# Patient Record
Sex: Female | Born: 1945 | Race: White | Hispanic: No | Marital: Married | Smoking: Never smoker
Health system: Southern US, Community
[De-identification: ages and names within clinical notes are randomized; demographics above are authoritative.]

## PROBLEM LIST (undated history)

## (undated) DIAGNOSIS — I10 Essential (primary) hypertension: Secondary | ICD-10-CM

## (undated) DIAGNOSIS — F410 Panic disorder [episodic paroxysmal anxiety] without agoraphobia: Secondary | ICD-10-CM

## (undated) DIAGNOSIS — K219 Gastro-esophageal reflux disease without esophagitis: Secondary | ICD-10-CM

## (undated) DIAGNOSIS — S82201A Unspecified fracture of shaft of right tibia, initial encounter for closed fracture: Secondary | ICD-10-CM

## (undated) DIAGNOSIS — I499 Cardiac arrhythmia, unspecified: Secondary | ICD-10-CM

## (undated) DIAGNOSIS — F411 Generalized anxiety disorder: Secondary | ICD-10-CM

## (undated) DIAGNOSIS — I82419 Acute embolism and thrombosis of unspecified femoral vein: Secondary | ICD-10-CM

## (undated) DIAGNOSIS — I480 Paroxysmal atrial fibrillation: Secondary | ICD-10-CM

## (undated) DIAGNOSIS — I7100 Dissection of unspecified site of aorta: Secondary | ICD-10-CM

## (undated) DIAGNOSIS — F1010 Alcohol abuse, uncomplicated: Secondary | ICD-10-CM

## (undated) HISTORY — DX: Unspecified fracture of shaft of right tibia, initial encounter for closed fracture: S82.201A

## (undated) HISTORY — DX: Gastro-esophageal reflux disease without esophagitis: K21.9

## (undated) HISTORY — DX: Generalized anxiety disorder: F41.1

## (undated) HISTORY — DX: Paroxysmal atrial fibrillation: I48.0

## (undated) HISTORY — DX: Alcohol abuse, uncomplicated: F10.10

## (undated) HISTORY — DX: Panic disorder (episodic paroxysmal anxiety): F41.0

## (undated) HISTORY — DX: Acute embolism and thrombosis of unspecified femoral vein: I82.419

---

## 1995-06-26 DIAGNOSIS — S82201A Unspecified fracture of shaft of right tibia, initial encounter for closed fracture: Secondary | ICD-10-CM

## 1995-06-26 HISTORY — DX: Unspecified fracture of shaft of right tibia, initial encounter for closed fracture: S82.201A

## 2017-02-26 HISTORY — PX: ASCENDING AORTIC ANEURYSM REPAIR W/ MECHANICAL AORTIC VALVE REPLACEMENT: SHX1192

## 2017-03-01 ENCOUNTER — Encounter: Payer: Self-pay | Admitting: Internal Medicine
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
## 2017-03-10 NOTE — PMR Pre-admission (Incomplete)
[facility] PMR Admission Coordinator Pre-Admission Assessment  Patient: Beth Austin is an 71 y.o., female MRN: [id number] DOB: 1945/09/03 Height:  (177.8 cm) Weight: 85.4 kg (188 lb 4.4 oz)  Insurance Information HMO:     PPO:      PCP:      IPA:      80/20:      OTHER: Open Access Plan Plus PRIMARY: [employer]      Policy#: [id number]      Subscriber: Self CM Name: Donny Pique      Phone#: [phone]     Fax#: [phone] Pre-Cert#: [id number] from 03/08/17-03/14/17      Employer:  Benefits:  Phone #: [phone]     Name: Verified via automated phone line Eff. Date: 06/25/16     Deduct: $250      Out of Pocket Max: $1200      Life Max: N/A CIR: 90%/10%      SNF: 90%/10% Outpatient: PT/OT/SLP     Co-Pay: $50 per visit  Home Health: $50 per calendar year then 90%      Co-Pay: 10% DME: 90%     Co-Pay: 10% Providers: In-network  SECONDARY: None      Policy#:       Subscriber:  CM Name:       Phone#:      Fax#:  Pre-Cert#:       Employer:  Benefits:  Phone #:      Name:  Eff. Date:      Deduct:       Out of Pocket Max:       Life Max:  CIR:       SNF:  Outpatient:      Co-Pay:  Home Health:       Co-Pay:  DME:      Co-Pay:   Medicaid Application Date:       Case Manager:  Disability Application Date:       Case Worker:   Emergency Contact Information Contact Information    Name Relation Home Work [place] Daughter   [phone]   Nobie Putnam [phone]        Current Medical History  Patient Admitting Diagnosis:   History of Present Illness: ***  Patient's medical record from [facility] has been reviewed by the rehabilitation admission coordinator and physician.  Past Medical History  No past medical history on file.  Family History   family history is not on file.  Prior Rehab/Hospitalizations Has the patient had major surgery during 100 days prior to admission? 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  She independent and active.    Prior Functional Level Self Care: Did the patient need help bathing, dressing, using the toilet or eating? Independent  Indoor Mobility: Did the patient need assistance with walking from room to room (with or without device)? Independent  Stairs: Did the patient need assistance with internal or external stairs (with or without device)? Independent  Functional Cognition: Did the patient need help planning regular tasks such as shopping or remembering to take medications? Independent  Home Assistive Devices / Equipment Home Assistive Devices/Equipment: None  Prior  Device Use: Indicate devices/aids used by the patient prior to current illness, exacerbation or injury? None of the above   Prior Functional Level Current Functional Level  Bed Mobility  Independent  Min assist   Transfers  Independent  Min assist   Mobility - Walk/Wheelchair  Independent  Min assist   Upper Body Dressing  Independent  Min assist   Lower Body Dressing  Independent  Mod assist   Grooming  Independent  Min assist   Eating/Drinking  Independent  Min assist   Toilet Transfer  Independent  Min assist   Bladder Continence   Continent   Continent    Bowel Management  Continent   Continent    Stair Climbing  Independent   TBA   Communication  Independent   TBA   Memory  Independent   TBA   Cooking/Meal Prep  Independent       Housework  Independent     Money Management  Independent     Driving  Walked in [place] and used the subway      Special needs/care consideration BiPAP/CPAP: NO CPM: No Continuous Drip IV:  No Dialysis: No        Life Vest: No Oxygen: No Special Bed: No Trach Size: No Wound Vac (area): No       Skin: WDL                              Location: Sternal surgical incision  Bowel mgmt: Continent  Bladder mgmt: Continent, last BM 03/06/17 Diabetic mgmt: No  Previous Home Environment Living Arrangements: Alone  Lives With: Alone Available Help at Discharge: Family, Available 24 hours/day Type of Home: Apartment Home Access: Elevator Bathroom Shower/Tub: Engineer, manufacturing systems: Standard Bathroom Accessibility: Yes How Accessible: Accessible via walker Home Care Services: No Additional Comments: Patient worked out with a Psychologist, educational with focus on balance prior to admission   Discharge Living Setting Plans for Discharge Living Setting: Other (Comment) (Patient from [place] & will stay with sister in [place], [place]) Type of Home at Discharge: House Discharge Home Layout: Two level, Able to live on main level with bedroom/bathroom, Other (Comment) (kitchen and living room upstairs) Alternate Level Stairs-Rails:  (Unknown) Alternate Level Stairs-Number of Steps: 10 Discharge Home Access: Level entry Discharge Bathroom Shower/Tub: Walk-in shower Discharge Bathroom Toilet: Standard Discharge Bathroom Accessibility: Yes How Accessible: Accessible via walker Does the patient have any problems obtaining your medications?: No  Social/Family/Support Systems Patient Roles: Parent, Other (Comment), Partner (Sister) Contact Information: Daughter: Katessa Attridge [phone] Anticipated Caregiver: Daughter, sister, and partner Anticipated Caregiver's Contact Information: see above  Ability/Limitations of Caregiver: None daughter can work remotely  Medical laboratory scientific officer: 24/7 Discharge Plan Discussed with Primary Caregiver: Yes Is Caregiver In Agreement with Plan?: Yes Does Caregiver/Family have Issues with Lodging/Transportation while Pt is in Rehab?: No  Goals/Additional  Needs Patient/Family Goal for Rehab: *** Expected length of stay: *** Cultural Considerations: None Dietary Needs: Regular textures and thin liquids  Equipment Needs: TBD Special Service Needs: None Additional Information: Plan to go stay at sister's in [place] with partner and daughter here to assist. Per [facility] patient will need to be cleared by MD there prior to going back to [place]. Discussed with patient and family. Pt/Family Agrees to Admission and willing to participate: Yes Program Orientation Provided & Reviewed with Pt/Caregiver Including Roles  & Responsibilities: Yes Additional Information Needs: None Information Needs to be Provided By:  N/A  Patient Condition: {PATIENT'S CONDITION:22832}  Preadmission Screen Completed By:  Fae Pippin, 03/10/2017 12:12 PM ______________________________________________________________________   Discussed status with Dr. Marland Kitchen on *** at *** and received telephone approval for admission today.  Admission Coordinator:  Fae Pippin, time ***Dorna Bloom ***   Assessment/Plan: Diagnosis: 1. Does the need for close, 24 hr/day  Medical supervision in concert with the patient's rehab needs make it unreasonable for this patient to be served in a less intensive setting? {yes_no_potentially:3041433} 2. Co-Morbidities requiring supervision/potential complications: *** 3. Due to {due ON:6295284}, does the patient require 24 hr/day rehab nursing? {yes_no_potentially:3041433} 4. Does the patient require coordinated care of a physician, rehab nurse, {coordinated XLKG:4010272} to address physical and functional deficits in the context of the above medical diagnosis(es)? {yes_no_potentially:3041433} Addressing deficits in the following areas: {deficits:3041436} 5. Can the patient actively participate in an intensive therapy program of at least 3 hrs of therapy 5 days a week? {yes_no_potentially:3041433} 6. The potential for patient to make measurable gains while on  inpatient rehab is {potential:3041437} 7. Anticipated functional outcomes upon discharge from inpatients are: {functional outcomes:304600100} PT, {functional outcomes:304600100} OT, {functional outcomes:304600100} SLP 8. Estimated rehab length of stay to reach the above functional goals is: *** 9. Does the patient have adequate social supports to accommodate these discharge functional goals? {yes_no_potentially:3041433} 10. Anticipated D/C setting: {anticipated dc setting:21604} 11. Anticipated post D/C treatments: {post dc treatment:21605} 12. Overall Rehab/Functional Prognosis: {potential:3041437}    RECOMMENDATIONS: This patient's condition is appropriate for continued rehabilitative care in the following setting: {appropriate setting:21606} Patient has agreed to participate in recommended program. {yes_no_potentially:3041433} Note that insurance prior authorization may be required for reimbursement for recommended care.  Comment:  Fae Pippin 03/10/2017

## 2017-03-11 ENCOUNTER — Encounter: Payer: Self-pay | Admitting: Physical Medicine and Rehabilitation

## 2017-03-11 ENCOUNTER — Encounter (HOSPITAL_COMMUNITY): Payer: Self-pay | Admitting: Nurse Practitioner

## 2017-03-11 ENCOUNTER — Other Ambulatory Visit: Payer: Self-pay | Admitting: Physical Medicine and Rehabilitation

## 2017-03-11 ENCOUNTER — Inpatient Hospital Stay (HOSPITAL_COMMUNITY)
Admission: RE | Admit: 2017-03-11 | Discharge: 2017-03-21 | DRG: 057 | Disposition: A | Discharge status: Home / Self Care | Payer: Managed Care, Other (non HMO) | Source: Other Acute Inpatient Hospital | Attending: Physical Medicine & Rehabilitation | Admitting: Physical Medicine & Rehabilitation

## 2017-03-11 DIAGNOSIS — Z9889 Other specified postprocedural states: Secondary | ICD-10-CM | POA: Diagnosis not present

## 2017-03-11 DIAGNOSIS — G4700 Insomnia, unspecified: Secondary | ICD-10-CM | POA: Diagnosis present

## 2017-03-11 DIAGNOSIS — I480 Paroxysmal atrial fibrillation: Secondary | ICD-10-CM | POA: Diagnosis present

## 2017-03-11 DIAGNOSIS — I69398 Other sequelae of cerebral infarction: Secondary | ICD-10-CM | POA: Diagnosis not present

## 2017-03-11 DIAGNOSIS — R270 Ataxia, unspecified: Secondary | ICD-10-CM | POA: Diagnosis present

## 2017-03-11 DIAGNOSIS — K219 Gastro-esophageal reflux disease without esophagitis: Secondary | ICD-10-CM | POA: Diagnosis present

## 2017-03-11 DIAGNOSIS — I638 Other cerebral infarction: Secondary | ICD-10-CM

## 2017-03-11 DIAGNOSIS — F1010 Alcohol abuse, uncomplicated: Secondary | ICD-10-CM | POA: Diagnosis not present

## 2017-03-11 DIAGNOSIS — I639 Cerebral infarction, unspecified: Secondary | ICD-10-CM | POA: Diagnosis present

## 2017-03-11 DIAGNOSIS — I4892 Unspecified atrial flutter: Secondary | ICD-10-CM | POA: Diagnosis present

## 2017-03-11 DIAGNOSIS — I7103 Dissection of thoracoabdominal aorta: Secondary | ICD-10-CM

## 2017-03-11 DIAGNOSIS — I6300 Cerebral infarction due to thrombosis of unspecified precerebral artery: Secondary | ICD-10-CM | POA: Diagnosis not present

## 2017-03-11 DIAGNOSIS — I6319 Cerebral infarction due to embolism of other precerebral artery: Secondary | ICD-10-CM | POA: Diagnosis not present

## 2017-03-11 DIAGNOSIS — Z23 Encounter for immunization: Secondary | ICD-10-CM

## 2017-03-11 DIAGNOSIS — F410 Panic disorder [episodic paroxysmal anxiety] without agoraphobia: Secondary | ICD-10-CM | POA: Diagnosis present

## 2017-03-11 DIAGNOSIS — I69393 Ataxia following cerebral infarction: Principal | ICD-10-CM

## 2017-03-11 DIAGNOSIS — F411 Generalized anxiety disorder: Secondary | ICD-10-CM

## 2017-03-11 DIAGNOSIS — R269 Unspecified abnormalities of gait and mobility: Secondary | ICD-10-CM | POA: Diagnosis not present

## 2017-03-11 DIAGNOSIS — Z86718 Personal history of other venous thrombosis and embolism: Secondary | ICD-10-CM | POA: Diagnosis not present

## 2017-03-11 DIAGNOSIS — R569 Unspecified convulsions: Secondary | ICD-10-CM | POA: Diagnosis not present

## 2017-03-11 DIAGNOSIS — I4891 Unspecified atrial fibrillation: Secondary | ICD-10-CM

## 2017-03-11 DIAGNOSIS — Z952 Presence of prosthetic heart valve: Secondary | ICD-10-CM | POA: Diagnosis not present

## 2017-03-11 DIAGNOSIS — I10 Essential (primary) hypertension: Secondary | ICD-10-CM | POA: Diagnosis present

## 2017-03-11 DIAGNOSIS — D649 Anemia, unspecified: Secondary | ICD-10-CM

## 2017-03-11 DIAGNOSIS — G8191 Hemiplegia, unspecified affecting right dominant side: Secondary | ICD-10-CM | POA: Diagnosis not present

## 2017-03-11 DIAGNOSIS — I63139 Cerebral infarction due to embolism of unspecified carotid artery: Secondary | ICD-10-CM | POA: Diagnosis not present

## 2017-03-11 HISTORY — DX: Essential (primary) hypertension: I10

## 2017-03-11 HISTORY — DX: Cardiac arrhythmia, unspecified: I49.9

## 2017-03-11 LAB — COMPREHENSIVE METABOLIC PANEL
ALT: 55 U/L — ABNORMAL HIGH (ref 14–54)
AST: 18 U/L (ref 15–41)
Albumin: 2.9 g/dL — ABNORMAL LOW (ref 3.5–5.0)
Alkaline Phosphatase: 78 U/L (ref 38–126)
Anion gap: 7 (ref 5–15)
BUN: 11 mg/dL (ref 6–20)
CHLORIDE: 105 mmol/L (ref 101–111)
CO2: 25 mmol/L (ref 22–32)
Calcium: 8.5 mg/dL — ABNORMAL LOW (ref 8.9–10.3)
Creatinine, Ser: 0.72 mg/dL (ref 0.44–1.00)
Glucose, Bld: 102 mg/dL — ABNORMAL HIGH (ref 65–99)
POTASSIUM: 4.2 mmol/L (ref 3.5–5.1)
Sodium: 137 mmol/L (ref 135–145)
TOTAL PROTEIN: 5.3 g/dL — AB (ref 6.5–8.1)
Total Bilirubin: 0.6 mg/dL (ref 0.3–1.2)

## 2017-03-11 MED ORDER — AMIODARONE HCL 200 MG PO TABS: 200.0000 mg | ORAL_TABLET | Freq: Every day | ORAL | Status: DC

## 2017-03-11 MED ORDER — TRAMADOL HCL 50 MG PO TABS
50.0000 mg | ORAL_TABLET | Freq: Four times a day (QID) | ORAL | Status: DC | PRN
  Administered 2017-03-11 – 2017-03-14 (×6): 50 mg via ORAL
  Filled 2017-03-11 (×6): qty 1

## 2017-03-11 MED ORDER — AMIODARONE HCL 200 MG PO TABS: 200.0000 mg | ORAL_TABLET | Freq: Two times a day (BID) | ORAL | Status: DC

## 2017-03-11 MED ORDER — ACETAMINOPHEN 325 MG PO TABS
325.0000 mg | ORAL_TABLET | ORAL | Status: DC | PRN
  Administered 2017-03-12 – 2017-03-20 (×4): 650 mg via ORAL
  Filled 2017-03-11 (×3): qty 2

## 2017-03-11 MED ORDER — LEVETIRACETAM 500 MG PO TABS
1000.0000 mg | ORAL_TABLET | Freq: Two times a day (BID) | ORAL | Status: DC
  Administered 2017-03-11 – 2017-03-21 (×20): 1000 mg via ORAL
  Filled 2017-03-11 (×20): qty 2

## 2017-03-11 MED ORDER — METOPROLOL TARTRATE 12.5 MG HALF TABLET
12.5000 mg | ORAL_TABLET | Freq: Two times a day (BID) | ORAL | Status: DC
  Administered 2017-03-11 – 2017-03-20 (×19): 12.5 mg via ORAL
  Filled 2017-03-11 (×20): qty 1

## 2017-03-11 MED ORDER — ENOXAPARIN SODIUM 40 MG/0.4ML ~~LOC~~ SOLN
40.0000 mg | SUBCUTANEOUS | Status: DC
  Administered 2017-03-11 – 2017-03-12 (×2): 40 mg via SUBCUTANEOUS
  Filled 2017-03-11 (×2): qty 0.4

## 2017-03-11 MED ORDER — HYDROCORTISONE 0.5 % EX CREA
TOPICAL_CREAM | Freq: Two times a day (BID) | CUTANEOUS | Status: DC
  Filled 2017-03-11: qty 28.35

## 2017-03-11 MED ORDER — ALPRAZOLAM 0.25 MG PO TABS
0.2500 mg | ORAL_TABLET | Freq: Three times a day (TID) | ORAL | Status: DC | PRN
  Administered 2017-03-11 – 2017-03-13 (×5): 0.25 mg via ORAL
  Filled 2017-03-11 (×5): qty 1

## 2017-03-11 MED ORDER — LIDOCAINE 5 % EX PTCH: 1.0000 | MEDICATED_PATCH | CUTANEOUS | Status: DC

## 2017-03-11 MED ORDER — ALUM & MAG HYDROXIDE-SIMETH 200-200-20 MG/5ML PO SUSP
30.0000 mL | ORAL | Status: DC | PRN
Start: 2017-03-11 — End: 2017-03-21

## 2017-03-11 MED ORDER — ATORVASTATIN CALCIUM 40 MG PO TABS
40.0000 mg | ORAL_TABLET | Freq: Every day | ORAL | Status: DC
  Administered 2017-03-11 – 2017-03-20 (×10): 40 mg via ORAL
  Filled 2017-03-11 (×10): qty 1

## 2017-03-11 MED ORDER — MIRTAZAPINE 15 MG PO TABS: 7.5000 mg | ORAL_TABLET | Freq: Every day | ORAL | Status: DC

## 2017-03-11 MED ORDER — PROCHLORPERAZINE EDISYLATE 5 MG/ML IJ SOLN: 5.0000 mg | Freq: Four times a day (QID) | INTRAMUSCULAR | Status: DC | PRN

## 2017-03-11 MED ORDER — POLYETHYLENE GLYCOL 3350 17 G PO PACK: 17.0000 g | PACK | Freq: Every day | ORAL | Status: DC | PRN

## 2017-03-11 MED ORDER — DIPHENHYDRAMINE HCL 12.5 MG/5ML PO ELIX: 12.5000 mg | ORAL_SOLUTION | Freq: Four times a day (QID) | ORAL | Status: DC | PRN

## 2017-03-11 MED ORDER — PROCHLORPERAZINE MALEATE 5 MG PO TABS: 5.0000 mg | ORAL_TABLET | Freq: Four times a day (QID) | ORAL | Status: DC | PRN

## 2017-03-11 MED ORDER — INFLUENZA VAC SPLIT HIGH-DOSE 0.5 ML IM SUSY
0.5000 mL | PREFILLED_SYRINGE | INTRAMUSCULAR | Status: AC
  Administered 2017-03-12: 0.5 mL via INTRAMUSCULAR
  Filled 2017-03-11: qty 0.5

## 2017-03-11 MED ORDER — SENNOSIDES-DOCUSATE SODIUM 8.6-50 MG PO TABS
1.0000 | ORAL_TABLET | Freq: Every day | ORAL | Status: DC
  Administered 2017-03-11: 1 via ORAL
  Filled 2017-03-11: qty 1

## 2017-03-11 MED ORDER — AMIODARONE HCL 200 MG PO TABS: 100.0000 mg | ORAL_TABLET | Freq: Two times a day (BID) | ORAL | Status: DC

## 2017-03-11 MED ORDER — FLEET ENEMA 7-19 GM/118ML RE ENEM: 1.0000 | ENEMA | Freq: Once | RECTAL | Status: DC | PRN

## 2017-03-11 MED ORDER — HYDROCORTISONE VALERATE 0.2 % EX CREA
TOPICAL_CREAM | Freq: Two times a day (BID) | CUTANEOUS | Status: DC
  Filled 2017-03-11: qty 15

## 2017-03-11 MED ORDER — GUAIFENESIN-DM 100-10 MG/5ML PO SYRP: 5.0000 mL | ORAL_SOLUTION | Freq: Four times a day (QID) | ORAL | Status: DC | PRN

## 2017-03-11 MED ORDER — AMIODARONE HCL 200 MG PO TABS: 400.0000 mg | ORAL_TABLET | Freq: Two times a day (BID) | ORAL | Status: DC

## 2017-03-11 MED ORDER — PANTOPRAZOLE SODIUM 20 MG PO TBEC
20.0000 mg | DELAYED_RELEASE_TABLET | Freq: Every day | ORAL | Status: DC
  Administered 2017-03-12 – 2017-03-21 (×10): 20 mg via ORAL
  Filled 2017-03-11 (×11): qty 1

## 2017-03-11 MED ORDER — PROCHLORPERAZINE 25 MG RE SUPP: 12.5000 mg | Freq: Four times a day (QID) | RECTAL | Status: DC | PRN

## 2017-03-11 MED ORDER — ALPRAZOLAM 0.25 MG PO TABS: 0.2500 mg | ORAL_TABLET | Freq: Two times a day (BID) | ORAL | Status: DC | PRN

## 2017-03-11 MED ORDER — IBUPROFEN 400 MG PO TABS
400.0000 mg | ORAL_TABLET | Freq: Four times a day (QID) | ORAL | Status: DC | PRN
  Filled 2017-03-11: qty 1

## 2017-03-11 MED ORDER — TRAZODONE HCL 50 MG PO TABS: 25.0000 mg | ORAL_TABLET | Freq: Every evening | ORAL | Status: DC | PRN

## 2017-03-11 MED ORDER — AMIODARONE HCL 200 MG PO TABS
200.0000 mg | ORAL_TABLET | Freq: Two times a day (BID) | ORAL | Status: DC
  Administered 2017-03-11 – 2017-03-13 (×4): 200 mg via ORAL
  Filled 2017-03-11 (×4): qty 1

## 2017-03-11 MED ORDER — ASPIRIN EC 325 MG PO TBEC
325.0000 mg | DELAYED_RELEASE_TABLET | Freq: Every day | ORAL | Status: DC
  Administered 2017-03-12 – 2017-03-13 (×2): 325 mg via ORAL
  Filled 2017-03-11 (×2): qty 1

## 2017-03-11 MED ORDER — BISACODYL 10 MG RE SUPP: 10.0000 mg | Freq: Every day | RECTAL | Status: DC | PRN

## 2017-03-11 NOTE — Consult Note (Addendum)
Cardiology Consultation   Patient ID: Beth Austin; [id number]; Sep 05, 1945   Admit date: 03/11/2017 Date of Consult: 03/11/2017  Referring Provider:  PM&R  Primary Care Provider: No primary care provider on file. Cardiologist: NA Electrophysiologist:  NA  Reason for Consultation: afib/flutter  History of Present Illness: Beth Austin is a 71 y.o. female who is being seen today for the evaluation of afib at the request of PMR service.  Beth Austin is a 71 year old female who was emergently admitted to [facility] 02/26/2017 with seizure, pain radiating from chest to back and RLE due to aortic dissection. She was flight from [place] to [place], started seizing and plane has to be diverted to [facility].  She was intubated in ED, RLE noted to be cold and taken to OR emergently for repair of thoracoabdominal aortic dissection with Bental procedure and AVR by Dr. Nolon Rod. 2D echo with EF 40-45% with mild LVH, mild to moderately global LVD and mild MVR/TVR.  She continued to have seizure with right sided motor activity on 9/6 and neurology recommended increasing Keppra to 1000 mg bid. To remain on keppra lifelong due to family reports of episodes suggestive of epilepsy going back to 10 years. 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 She developed A fib/A flutter requiring DCCV 9/11 by Dr.  and continues on BB and amiodarone for rate control. It seems that she was transferred to [facility] for rehab due to family connections in the area. We are called to see pt due to recurrence of afib/atrial flutter w/ RVR on EKG with HR ~100-120s. Pt tells me she was in NSR when leaving [place] today around noon, but was in afib/flutter by the time she arrived at [facility] this evening. By the time I came to see pt, she was in NSR with HR 70s. She tells me she was completely asymptomatic in regard to the afib; she has not had palpitations, worsening SOB while in the  arrhythmia, dizziness or any other sx. She was unaware her heart was out of rhythm. Nothing specific has been done for the afib this evening aside from pt getting her scheduled doses of lopressor 12.5mg  and amiodarone .   Past Medical History:  Diagnosis Date  . Alcohol abuse   . Dvt femoral (deep venous thrombosis) (HCC)    after tibial fracture  . Dysrhythmia   . Generalized anxiety disorder with panic attacks   . GERD (gastroesophageal reflux disease)   . Hypertension   . PAF (paroxysmal atrial fibrillation) (HCC)    resolved with carotid massage in the past  . Right tibial fracture 1997   treated with cast    Past Surgical History:  Procedure Laterality Date  . ASCENDING AORTIC ANEURYSM REPAIR W/ MECHANICAL AORTIC VALVE REPLACEMENT  02/26/2017      Current Medications: . amiodarone  200 mg Oral BID   Followed by  . [START ON 03/18/2017] amiodarone  100 mg Oral BID  . [START ON 03/12/2017] aspirin EC  325 mg Oral Daily  . atorvastatin  40 mg Oral q1800  . enoxaparin (LOVENOX) injection  40 mg Subcutaneous Q24H  . hydrocortisone cream   Topical BID  . [START ON 03/12/2017] Influenza vac split quadrivalent PF  0.5 mL Intramuscular Tomorrow-1000  . levETIRAcetam  1,000 mg Oral BID  . metoprolol tartrate  12.5 mg Oral BID  . [START ON 03/12/2017] pantoprazole  20 mg Oral Daily  . senna-docusate  1  tablet Oral QHS    Infused Medications:   PRN Medications: acetaminophen, ALPRAZolam, alum & mag hydroxide-simeth, bisacodyl, diphenhydrAMINE, guaiFENesin-dextromethorphan, ibuprofen, polyethylene glycol, prochlorperazine **OR** prochlorperazine **OR** prochlorperazine, sodium phosphate, traMADol, traZODone   Allergies:    Allergies  Allergen Reactions  . Latex Itching    Pain patch put on back at [facility] caused itching    Social History:   The patient      Family History:   The patient's family history includes AAA (abdominal aortic aneurysm) (age of onset: 84) in  her mother; Colon cancer in her sister; Lung cancer (age of onset: 50) in her father; Stroke in her maternal grandmother.   ROS:  Please see the history of present illness.  All other ROS reviewed and negative.     Vital Signs: Blood pressure 134/73, pulse (!) 127, temperature 98.4 F (36.9 C), temperature source Oral, height  (1.778 m), weight 85.3 kg (188 lb), SpO2 (!) 83 %.   PHYSICAL EXAM: General:  Well nourished, well developed, in no acute distress HEENT: normal Lymph: no adenopathy Neck: no JVD Endocrine:  No thryomegaly Vascular: No carotid bruits; DP pulses 2+ bilaterally   Cardiac:  Healing surgical scar. normal S1, mechanical S2; RRR; 2/6 sys murmur at the base. Lungs:  clear to auscultation bilaterally anteriorly, no wheezing, rhonchi or rales  Abd: soft, nontender, no hepatomegaly  Ext: no edema Musculoskeletal:  No deformities, BUE and BLE strength normal and equal Skin: warm and dry  Neuro:  CNs 2-12 intact, no focal abnormalities noted Psych:  Normal affect   EKG:  Atrial flutter w/ variable AV block, HR 95 NSR on the monitor  Labs: No results for input(s): CKTOTAL, CKMB, TROPONINI in the last 72 hours. No results for input(s): TROPIPOC in the last 72 hours.  No results found for: WBC, HGB, HCT, MCV, PLT No results for input(s): NA, K, CL, CO2, BUN, CREATININE, CALCIUM, PROT, BILITOT, ALKPHOS, ALT, AST, GLUCOSE in the last 168 hours.  Invalid input(s): LABALBU No results found for: CHOL, HDL, LDLCALC, TRIG No results found for: DDIMER  Radiology/Studies:  No results found.  ASSESSMENT AND PLAN:  1. Atrial fib/flutter: she is not on OAC due to recent aortic dissection repair. She has been getting amiodarone PO but not sure the extent of the loading dose she has gotten so far. She is now back in NSR w/o intervention. I am going to increase the amiodarone to  bid; we can try to get some records from [facility]. No need for further intervention at this  time. She has had AVR; notes indicate the valve is mechanical. She will need to be on coumadin for that which will help decrease afib-related risk of CVA.  She is currently on lovenox but appears to be a prophylactic dose. Surgery may have wanted to wait before starting therapeutic A/C but this needs to be investigated further.  2. Aortic dissection: s/p repair, stable. Cont rehab as per PMR  3. Mild LV systolic dysfunction: per reports, echo at [facility] showed EF 45%. No need for repeat TTE at this time  4. S/p AVR: mechanical prosthesis. Obtain echo report from [facility]. Needs A/C--need more info from [facility] re: what the surgeons wanted to do with that   Thank you for the opportunity to participate in the care of this very pleasant patient. Will follow. Please call w/ questions.   Precious Reel, MD , [facility] 03/11/17 9:45 PM  ADDENDUM: as pt is back in NSR currently, I  am actually going to leave the amiodarone dose at  bid for now. It appears that she is completing the loading regimen as directed by the surgeons. If she has recurrent afib tonight, can bolus w/ amio IV 150.

## 2017-03-11 NOTE — Progress Notes (Signed)
Patient ID: Beth Austin, female   DOB: 12-Jun-1946, 71 y.o.   MRN: [id number] Patient admitted to [facility] via EMS transport accompanied by son and daughter.  Patient and family oriented to rehab process.  Marissa Nestle, PA and Dr. Allena Katz present to perform admission assessment.  Patient appears a bit anxious, as does the family, but appears to be in no immediate distress at this time.  Dani Gobble, RN

## 2017-03-11 NOTE — Progress Notes (Signed)
Fae Pippin Rehab Admission Coordinator Signed Physical Medicine and Rehabilitation  PMR Pre-admission Encounter Date: 03/10/2017  Related encounter: Documentation from 03/10/2017 in [facility] MEDICINE AND REHABILITATION       Hide copied text                   Secondary Market PMR Admission Coordinator Pre-Admission Assessment  Patient:Beth Austin an 70 y.o., female ZOX:[id number] DOB:February 18, 1946 Height:5\' 10"  (177.8 cm) Weight:85.4 kg (188 lb 4.4 oz)  Insurance Information HMO: PPO: PCP: IPA:80/20: OTHER: Open Access Plan Plus PRIMARY: CignaPolicy#: [id number] Subscriber: Self CM Name: Darl Pikes FrontePhone#: [phone] Fax#: [phone] Pre-Cert#: [id number] from 03/08/17-9/20/18Employer:  Benefits: Phone #: [phone]: Verified via automated phone line Eff. Date: 1/1/18Deduct: $250Out of Pocket Max: $1200Life Max: N/A CIR: 90%/10%SNF: 90%/10% Outpatient:PT/OT/SLPCo-Pay: $50 per visit  Home Health: $50 per calendar year then 90%Co-Pay: 10% DME: 90%Co-Pay: 10% Providers: In-network  SECONDARY: NonePolicy#: Subscriber:  CM Name: Phone#: Fax#:  Pre-Cert#: Employer:  Benefits: Phone #: Name:  Eff. Date: Deduct: Out of Pocket Max: Life Max:  CIR: SNF:  Outpatient:Co-Pay:  Home Health: Co-Pay:  DME: Co-Pay:   Medicaid Application Date:Case Manager: Disability Application Date:Case Worker:  Emergency Actuary Information   Name Relation Home Work [place] Daughter   [phone]   Nobie Putnam [phone]        Current Medical History Patient Admitting Diagnosis:Aortic dissection and acute or early subacute small vessel infarct involving the left posterior limb of the internal  capsule  History of Present Illness: Beth Austin is a 71 year old female, with a past medical history significant for epilepsy.  She presented to [facility] with a type A aortic dissection on 02/26/17 after her flight from [place], [place] to [place] was emergently landed due to reportedly seizing on the plane.  Patient intubated 02/26/17-03/01/17.  Cardiology and neurology consulted and followed; MRI revealed acute or early subacute infarct.  Therapies initiated 03/03/17 with recommendations for intensive post acute therapies given her independence prior to.  Patient medically stable and admitted to [facility] Rehabilitation 03/11/17.  Patient's medical record from [facility] has been reviewed by the rehabilitation admission coordinator and physician.  Past Medical History Epilepsy.  Family History family history is not on file.  Prior Rehab/Hospitalizations Has the patient had major surgery during 100 days prior to admission? No  Current Medications Acetaminophen Amiodarone Asprin Bisacodyl Heparin Hydrocortisone Insulin Levetiracetam Lidocaine Melatonin Metoprolol tartrate Pantoprazole Pneumococcal Senna-docusate  Patients Current Diet: Regular textures and thin liquids with heart healthy restrictions   Precautions / Restrictions Precautions Precautions: Sternal Restrictions Weight Bearing Restrictions: No  Has the patient had 2 or more falls or a fall with injury in the past year?No  Prior Activity Level Community (5-7x/wk): Prior to admission patient lived in [place] and worked as a Airline pilot. She independent and active.   Prior Functional Level Self Care: Did the patient need help bathing, dressing, using the toilet or eating? Independent  Indoor Mobility: Did the patient need assistance with walking from room to room (with or without device)? Independent  Stairs: Did the patient need assistance with internal or external stairs (with  or without device)? Independent  Functional Cognition: Did the patient need help planning regular tasks such as shopping or remembering to take medications? Independent  Home Assistive Devices / Equipment Home Assistive Devices/Equipment: None  Prior Device Use: Indicate devices/aids used by the patient prior to current illness, exacerbation or injury? None of the  above   Prior Functional Level Current Functional Level  Bed Mobility  Independent  Min assist   Transfers  Independent  Min assist   Mobility - Walk/Wheelchair  Independent  Min assist   Upper Body Dressing  Independent  Min assist   Lower Body Dressing  Independent  Mod assist   Grooming  Independent  Min assist   Eating/Drinking  Independent  Min assist   Toilet Transfer  Independent  Min assist   Bladder Continence   Continent   Continent    Bowel Management  Continent   Continent    Stair Climbing  Independent   TBA   Communication  Independent   TBA   Memory  Independent   TBA   Cooking/Meal Prep  Independent     Housework  Independent     Money Management  Independent     Driving  Walked in [place] and used the subway      Special needs/care consideration BiPAP/CPAP: No CPM: No Continuous Drip IV: No Dialysis: No Life Vest: No Oxygen: No Special Bed: No Trach Size: No Wound Vac (area): No Skin: WDLLocation: Sternal surgical incision  Bowel mgmt: Continent  Bladder mgmt: Continent, last BM 03/06/17 Diabetic mgmt: No  Previous Home Environment Living Arrangements: Alone Lives With: Alone Available Help at Discharge: Family, Available 24 hours/day Type of Home: Apartment Home Access: Elevator Bathroom Shower/Tub: Engineer, manufacturing systems: Standard Bathroom Accessibility: Yes How Accessible: Accessible via walker Home Care Services: No Additional  Comments: Patient worked out with a Psychologist, educational with focus on balance prior to admission   Discharge Living Setting Plans for Discharge Living Setting: Other (Comment) (Patient from [place] & will stay with sister in [place], [place]) Type of Home at Discharge: House Discharge Home Layout: Two level, Able to live on main level with bedroom/bathroom, Other (Comment) (kitchen and living room upstairs) Alternate Level Stairs-Rails: (Unknown) Alternate Level Stairs-Number of Steps: 10 Discharge Home Access: Level entry Discharge Bathroom Shower/Tub: Walk-in shower Discharge Bathroom Toilet: Standard Discharge Bathroom Accessibility: Yes How Accessible: Accessible via walker Does the patient have any problems obtaining your medications?: No  Social/Family/Support Systems Patient Roles: Parent, Other (Comment), Partner (Sister) Contact Information: Daughter: Jaycie Kregel [phone] Anticipated Caregiver: Daughter, sister, and partner Anticipated Caregiver's Contact Information: see above  Ability/Limitations of Caregiver: None daughter can work remotely  Medical laboratory scientific officer: 24/7 Discharge Plan Discussed with Primary Caregiver: Yes Is Caregiver In Agreement with Plan?: Yes Does Caregiver/Family have Issues with Lodging/Transportation while Pt is in Rehab?: No  Goals/Additional Needs Patient/Family Goal for Rehab: PT/OT/SLP Supervision-Mod I Expected length of stay: 10-14 days Cultural Considerations: None Dietary Needs: Regular textures and thin liquids  Equipment Needs: TBD Special Service Needs: None Additional Information: Plan to go stay at sister's in [place] with partner and daughter here to assist. Per [facility] patient will need to be cleared by MD there prior to going back to [place]. Discussed with patient and family. Pt/Family Agrees to Admission and willing to participate: Yes Program Orientation Provided &Reviewed with Pt/Caregiver Including Roles &Responsibilities:  Yes Additional Information Needs: None Information Needs to be Provided By: N/A  Patient Condition:Patient's medical record has been reviewed and I have have spoken with daughter and patient over the phone.  Given that this patient was full independent and active prior to admission she makes an excellent IP Rehab candidate.  Additionally, she has good family support from out of town as well as locally her in the [place] area, who will be assisting  post discharge.  Currently, she requires medical management for cardiac and neuro recovery and she would greatly benefit from 24/7 nursing and daily doctor visits.  Further more, since she is requiring Min-Mod A with activities of daily living she would also benefit from the intensive 3 hours of skilled therapy a day.    Preadmission Screen Completed By: Fae Pippin, 9/16/201812:12 PM ______________________________________________________________________  Discussed status with Dr. Allena Katz on 03/11/17 at 1030 and received telephone approval for admission today.  Admission Coordinator: Fae Pippin, time 1030/Date 03/11/17   Assessment/Plan: Diagnosis:  left posterior limb of the internal capsule  1. Does the need for close, 24 hr/day Medical supervision in concert with the patient's rehab needs make it unreasonable for this patient to be served in a less intensive setting? Yes 2. Co-Morbidities requiring supervision/potential complications: epilepsy 3. Due to safety, disease management and patient education, does the patient require 24 hr/day rehab nursing? Yes 4. Does the patient require coordinated care of a physician, rehab nurse, PT (1-2 hrs/day, 5 days/week), OT (1-2 hrs/day, 5 days/week) and SLP (1-2 hrs/day, 5 days/week)to address physical and functional deficits in the context of the above medical diagnosis(es)? Yes Addressing deficits in the following areas: balance, endurance, locomotion, strength, transferring, bathing, dressing,  toileting, speech, swallowing and psychosocial support 5. Can the patient actively participate in an intensive therapy program of at least 3 hrs of therapy 5 days a week? Yes 6. The potential for patient to make measurable gains while on inpatient rehab is excellent 7. Anticipated functional outcomes upon discharge from inpatients are: supervisionPT, supervisionOT, modified independentSLP 8. Estimated rehab length of stay to reach the above functional goals is: 12-16 days. 9. Does the patient have adequate social supports to accommodate these discharge functional goals? Potentially 10. Anticipated D/C setting: Home 11. Anticipated post D/C treatments: HH therapy and Home excercise program 12. Overall Rehab/Functional Prognosis: good    RECOMMENDATIONS: This patient's condition is appropriate for continued rehabilitative care in the following setting: CIR Patient has agreed to participate in recommended program. Yes Note that insurance prior authorization may be required for reimbursement for recommended care.  Maryla Morrow, MD, [place] [facility] 03/11/17 Fae Pippin 03/10/2017     Revision History

## 2017-03-11 NOTE — Significant Event (Signed)
Rapid Response Event Note  Overview: Time Called: 1951 Arrival Time: 2000    Initial Focused Assessment: Called by [facility], RN for pt is Atrial  fib with RVR  Upon my arrival to room pt is alert, orieted x 4, cooperative, MAE, looking frightened.  Denies CP or SOB.  12 Lead EKG at 1911  Shows AFIB RVR rate 123.  134/73 RR 18    99% on 4l [place].  97.5 oral . Palpable pulses throughout.  abd soft, active bowel sounds.   Interventions: Placed on portable cardiac monitor in AFIB rate 105.    IV access obtained and pt given NS 500 cc bolus.    Scheduled Lopressor 12.5 mg po and Amiodarone 200 mg given.  Prn Xanax .25 given for anxiety.  UPdated family at bedside   Repeat EKG:  Atrial flutter with variable block.  Rate 97  BP 122/68   20    Plan of Care (if not transferred): awaiting cardiology consult.  Continue to monitor heart rate and rhythm Hand off report given to [facility], RN  Will continue to follow as needed.  Pt and family updated on POC  :  Event Summary: Name of Physician Notified: Deatra Ina, PA at [phone]  Name of Consulting Physician Notified: Cardiology at    Outcome: Stayed in room and stabalized     Winfrey Chillemi, [place]

## 2017-03-11 NOTE — Progress Notes (Signed)
Patient was admitted to IP Rehab today from the [facility]. Patient was noted by previous nurse to have an irregular heart rate & an EKG was performed. Abnormal results were given to on call provider. Provider recieved information for on call cardiac providers. After that communication, patient's oxygen saturation had dropped into the low 80's. Another EKG was taken & the results showed A FIb & A flutter. Heart rate was ranging from 80's-130's. Previous nurse called rapid response & an IV was started for emergency fluids & possible medication administration. Oxygen given via nasal canula at 3.5 lpm. A portable heart monitor was placed & patient given her scheduled dose of amiodarone & lopressor and a does of xanax. On call cardiac team was contacted & patient was stabilizing. Cardiac provider arrived at approximately 2120 & saw the patient. Another EKG was performed. Patient had stabilized, HR, O2 sat, BP were WNL. No symptoms reported. Labs were ordered & drawn. Other scheduled meds given, no c/o pain or discomfort. On call provider was call to give update & he gave orders to stop the IV infusion & oxygen at this time. Will evaluate in the morning. No acute distress noted at this time. Will continue to monitor.

## 2017-03-11 NOTE — Progress Notes (Signed)
Patients HR was irregular when RN auscultated earlier.  Discussed with Marissa Nestle, PA of her findings, she reported her HR was regular.  Ordered to get a baseline EKG.  Abnormal EKG. Report reads: "Atrial fibrillation with rapid ventricular response.  Nonspecific ST and T wave abnormality.  Abnormal ECG." Called Deatra Ina, PA.  He is consulting cardiology.  Patient shivering saying she is cold.  HR noted to range anywhere from 112-133.  Patient denies any chest pain or pressure.  Dani Gobble, RN

## 2017-03-11 NOTE — H&P (Signed)
Physical Medicine and Rehabilitation Admission H&P    CC: AAA aneurysm rupture   HPI:  Beth Austin is a 71 year old female who was emergently admitted to [facility] 02/26/2017 with seizure, pain radiating from chest to back and RLE due to aortic dissection. She was flight from [place] to [place], started seizing and plane has to be diverted to RDU.  She was intubated in ED, RLE noted to be cold and taken to OR emergently for repair of thoracoabdominal aortic dissection with Bental procedure and AVR by Dr. Nolon Rod. 2D echo with EF 40-45% with mild LVH, mild to moderately global LVD and mild MVR/TVR.  She continued to have seizure with right sided motor activity on 9/6 and neurology recommended increasing Keppra to 1000 mg bid. To remain on keppra lifelong due to family reports of episodes suggestive of epilepsy going back to 10 years. 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 She developed A fib/A flutter requiring DCCV 9/11 by Dr.  and continues on BB and amiodarone for rate control. Anxiety/ insomnia being managed with prn xanax. Fluid overload treated with diuresis. Pre op EF < 40%--blood pressures remain on low side--SBP< 100,  limiting ACE/ARB use.     Review of Systems  Respiratory: Positive for shortness of breath.   Cardiovascular: Positive for chest pain (Incisional).  Musculoskeletal: Negative for back pain and myalgias.  Psychiatric/Behavioral: The patient is nervous/anxious and has insomnia.   All other systems reviewed and are negative.    Past Medical History:  Diagnosis Date  . Alcohol abuse   . Dvt femoral (deep venous thrombosis) (HCC)    after tibial fracture  . Dysrhythmia   . Generalized anxiety disorder with panic attacks   . GERD (gastroesophageal reflux disease)   . Hypertension   . PAF (paroxysmal atrial fibrillation) (HCC)    resolved with carotid massage in the past  . Right tibial fracture 1997   treated with cast    Past Surgical History:  Procedure Laterality Date  . ASCENDING AORTIC ANEURYSM REPAIR W/ MECHANICAL AORTIC VALVE REPLACEMENT  02/26/2017      Family History  Problem Relation Age of Onset  . AAA (abdominal aortic aneurysm) Mother 45       diet of rupture  . Lung cancer Father 20  . Colon cancer Sister   . Stroke Maternal Grandmother       Social History:  Single. Lives alone in [place] -works out daily with trainer?  She does not use tobacco. Uses alcohol   drug history on file.    Allergies: No Known Allergies  (Not in a hospital admission)  Drug Regimen Review  Drug regimen was reviewed and remains appropriate with no significant issues identified  Home:    Functional History: Independent PTA.    Functional Status:  Mobility: Min assist for transfers with min cues to maintain sternal precautions.  CGA for standing balance Able to ambulate 50' with rest breaks and cues for posture/RW use.     ADL: Needs verbal/visual cues to complete ADL tasks safely.  Min assist for grooming. Min assist for UB care Mod assist for LB care   Cognition: Mild to moderate cognitive deficits with MoCA 22/30. Mild impulsivity with poor insight/awareness of deficits and decreased recall   There were no vitals taken for this visit. Physical Exam  Nursing note reviewed. Constitutional: She is oriented to person, place, and time. She appears well-developed and well-nourished.  No distress.  HENT:  Head: Normocephalic and atraumatic.  Mouth/Throat: Oropharynx is clear and moist.  Eyes: Pupils are equal, round, and reactive to light. Conjunctivae and EOM are normal.  Neck: Normal range of motion. Neck supple.  Cardiovascular: Normal rate and regular rhythm.   Murmur heard. Respiratory: Effort normal and breath sounds normal. No respiratory distress. She has no wheezes.  GI: Soft. Bowel sounds are normal. She exhibits no distension. There is no tenderness.    Musculoskeletal: She exhibits no edema or tenderness.  Neurological: She is alert and oriented to person, place, and time. No cranial nerve deficit.  Motor: 4-4+/5 grossly throughout Sensation intact to light touch DTRs symmetric  Skin: Skin is warm and dry. No rash noted. She is not diaphoretic. No erythema.  Chest incision c/d/i  Psychiatric: Her speech is normal and behavior is normal. Her mood appears anxious.    Labs:  9/14: WBC-10.0   HGB- 7.9  HCT- 24.0  Plt - 300 9/16:  Na- 141  K- 4.0  CO2- 27  Cl- 108   BUN- 13  SCr- 0.62   Gluc- 108       Medical Problem List and Plan: 1.  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 2.  DVT Prophylaxis/Anticoagulation: Pharmaceutical: Lovenox 3. Pain Management: Will continue ibuprofen or ultram prn 4. Mood: LCSW to follow for evaluation and support. 5. Neuropsych: This patient is not fully capable of making decisions on her own behalf. 6. Skin/Wound Care: Monitor wound daily for healing. Maintain adequate hydration and nutritional status 7. Fluids/Electrolytes/Nutrition: Monitor I/O. Check lytes in am. 8. A fib s/p DCCV: Monitor HR bid. Continue amiodarone. Metoprolol resumed today--monitor for orthostatic symptoms.  9. Anxiety disorder: Continue Xanax prn. 10. Seizure activity: On keppra bid for lifelong basis per neurology. 11. H/o Alcohol abuse: Has competed CIWA protocol.     Post Admission Physician Evaluation: 1. Preadmission assessment reviewed and changes made below. 2. Functional deficits secondary  to aortic dissection and left posterior limb internal capsule. 3. Patient is admitted to receive collaborative, interdisciplinary care between the physiatrist, rehab nursing staff, and therapy team. 4. Patient's level of medical complexity and substantial therapy needs in context of that medical necessity cannot be provided at a lesser intensity of care  such as a SNF. 5. Patient has experienced substantial functional loss from his/her baseline which was documented above under the "Functional History" and "Functional Status" headings.  Judging by the patient's diagnosis, physical exam, and functional history, the patient has potential for functional progress which will result in measurable gains while on inpatient rehab.  These gains will be of substantial and practical use upon discharge  in facilitating mobility and self-care at the household level. 6. Physiatrist will provide 24 hour management of medical needs as well as oversight of the therapy plan/treatment and provide guidance as appropriate regarding the interaction of the two. 7. 24 hour rehab nursing will assist with safety, skin/wound care, disease management, pain management and patient education  and help integrate therapy concepts, techniques,education, etc. 8. PT will assess and treat for/with: Lower extremity strength, range of motion, stamina, balance, functional mobility, safety, adaptive techniques and equipment, woundcare, coping skills, pain control, education.   Goals are: Supervision. 9. OT will assess and treat for/with: ADL's, functional mobility, safety, upper extremity strength, adaptive techniques and equipment, wound mgt, ego support, and community reintegration.   Goals are: Supervision. Therapy may not proceed with showering this  patient. 10. SLP will assess and treat for/with: cognition.  Goals are: Mod I. 11. Case Management and Social Worker will assess and treat for psychological issues and discharge planning. 12. Team conference will be held weekly to assess progress toward goals and to determine barriers to discharge. 13. Patient will receive at least 3 hours of therapy per day at least 5 days per week. 14. ELOS: 12-16 days.       15. Prognosis:  good  Izzabelle Bouley Karis Juba, MD 03/11/2017  Delle Reining, PA 03/11/17

## 2017-03-12 ENCOUNTER — Inpatient Hospital Stay (HOSPITAL_COMMUNITY): Payer: Self-pay | Admitting: Speech Pathology

## 2017-03-12 ENCOUNTER — Inpatient Hospital Stay (HOSPITAL_COMMUNITY): Payer: Self-pay | Admitting: Physical Therapy

## 2017-03-12 ENCOUNTER — Encounter (HOSPITAL_COMMUNITY): Payer: Self-pay

## 2017-03-12 ENCOUNTER — Inpatient Hospital Stay (HOSPITAL_COMMUNITY): Payer: Self-pay | Admitting: Occupational Therapy

## 2017-03-12 DIAGNOSIS — I480 Paroxysmal atrial fibrillation: Secondary | ICD-10-CM

## 2017-03-12 DIAGNOSIS — I6300 Cerebral infarction due to thrombosis of unspecified precerebral artery: Secondary | ICD-10-CM

## 2017-03-12 LAB — CBC WITH DIFFERENTIAL/PLATELET
BASOS PCT: 0 %
Basophils Absolute: 0 10*3/uL (ref 0.0–0.1)
EOS ABS: 0 10*3/uL (ref 0.0–0.7)
EOS PCT: 0 %
HCT: 25.8 % — ABNORMAL LOW (ref 36.0–46.0)
HEMOGLOBIN: 8 g/dL — AB (ref 12.0–15.0)
Lymphocytes Relative: 15 %
Lymphs Abs: 1.5 10*3/uL (ref 0.7–4.0)
MCH: 28.9 pg (ref 26.0–34.0)
MCHC: 31 g/dL (ref 30.0–36.0)
MCV: 93.1 fL (ref 78.0–100.0)
MONOS PCT: 8 %
Monocytes Absolute: 0.8 10*3/uL (ref 0.1–1.0)
NEUTROS PCT: 77 %
Neutro Abs: 7.7 10*3/uL (ref 1.7–7.7)
PLATELETS: 366 10*3/uL (ref 150–400)
RBC: 2.77 MIL/uL — ABNORMAL LOW (ref 3.87–5.11)
RDW: 14 % (ref 11.5–15.5)
WBC: 10.1 10*3/uL (ref 4.0–10.5)

## 2017-03-12 LAB — COMPREHENSIVE METABOLIC PANEL
ALBUMIN: 3 g/dL — AB (ref 3.5–5.0)
ALK PHOS: 89 U/L (ref 38–126)
ALT: 54 U/L (ref 14–54)
ANION GAP: 7 (ref 5–15)
AST: 20 U/L (ref 15–41)
BUN: 9 mg/dL (ref 6–20)
CO2: 25 mmol/L (ref 22–32)
Calcium: 8.6 mg/dL — ABNORMAL LOW (ref 8.9–10.3)
Chloride: 105 mmol/L (ref 101–111)
Creatinine, Ser: 0.8 mg/dL (ref 0.44–1.00)
GFR calc Af Amer: >60 mL/min (ref >60)
GFR calc non Af Amer: >60 mL/min (ref >60)
GLUCOSE: 96 mg/dL (ref 65–99)
POTASSIUM: 3.8 mmol/L (ref 3.5–5.1)
SODIUM: 137 mmol/L (ref 135–145)
TOTAL PROTEIN: 5.7 g/dL — AB (ref 6.5–8.1)
Total Bilirubin: 0.6 mg/dL (ref 0.3–1.2)

## 2017-03-12 MED ORDER — SENNOSIDES-DOCUSATE SODIUM 8.6-50 MG PO TABS
1.0000 | ORAL_TABLET | Freq: Every evening | ORAL | Status: DC | PRN
Start: 2017-03-12 — End: 2017-03-21

## 2017-03-12 MED ORDER — PRO-STAT SUGAR FREE PO LIQD
30.0000 mL | Freq: Two times a day (BID) | ORAL | Status: DC
  Administered 2017-03-12 – 2017-03-21 (×17): 30 mL via ORAL
  Filled 2017-03-12 (×18): qty 30

## 2017-03-12 NOTE — Progress Notes (Signed)
Patient information reviewed and entered into eRehab system by Tyon Cerasoli, RN, CRRN, PPS Coordinator.  Information including medical coding and functional independence measure will be reviewed and updated through discharge.    

## 2017-03-12 NOTE — Progress Notes (Signed)
MEDICATION RELATED NOTE   Pharmacy Re:  Home Meds  Assessment: 71 yo female (Beth Austin, Beth Austin) was admitted from [facility].  She was placed on multiple medications there which have been included in her home medications.  Per pt prior to this event on 02/26/17 she took prilosec OTC, aspirin 81 mg and 2 adult multivitamins every morning.  Plan:  - We have marked her record complete - please review those medications you feel she needs for care now and at discharge.  Medications:  Prescriptions Prior to Admission  Medication Sig Dispense Refill Last Dose  . acetaminophen (TYLENOL) 325 MG tablet Take 650 mg by mouth every 6 (six) hours as needed for headache (pain).    03/11/2017 at 1151  . ALPRAZolam (XANAX) 0.25 MG tablet Take 0.25 mg by mouth 2 (two) times daily as needed for anxiety.   03/10/2017 at 2002  . amiodarone (PACERONE) 200 MG tablet Take 200-400 mg by mouth See admin instructions. RX written 03/11/17: take 2 tablets (400 mg) by mouth twice daily for one week, then take 1 tablet (200 mg) by mouth twice daily for one week, then take 1 tablet (200 mg) daily for 2 weeks, then stop   03/11/2017 at 900  . aspirin EC 81 MG tablet Take 162 mg by mouth daily.   03/11/2017 at 0902  . atorvastatin (LIPITOR) 40 MG tablet Take 40 mg by mouth every evening.    03/10/2017 at 1715  . bisacodyl (DULCOLAX) 10 MG suppository Place 10 mg rectally daily.   03/11/2017 at 0903  . hydrocortisone 2.5 % cream Apply 1 application topically 2 (two) times daily.   03/11/2017 at 0906  . ibuprofen (ADVIL,MOTRIN) 200 MG tablet Take 400 mg by mouth every 6 (six) hours as needed for headache (pain).    03/11/2017 at 0327  . levETIRAcetam (KEPPRA) 1000 MG tablet Take 1,000 mg by mouth 2 (two) times daily.   03/11/2017 at 0902  . lidocaine (LIDODERM) 5 % Place 1 patch onto the skin daily. Remove & Discard patch within 12 hours or as directed by MD   03/10/2017 at 1715  . Melatonin 3 MG CAPS Take 6 mg by mouth at bedtime as needed  (sleep).    03/10/2017 at 2032  . metoprolol tartrate (LOPRESSOR) 25 MG tablet Take 12.5 mg by mouth 2 (two) times daily.   not yet given  . pantoprazole (PROTONIX) 20 MG tablet Take 20 mg by mouth daily.   03/11/2017 at 0902  . sennosides-docusate sodium (SENOKOT-S) 8.6-50 MG tablet Take 1 tablet by mouth 2 (two) times daily.   03/11/2017 at 0902  . traMADol (ULTRAM) 50 MG tablet Take 50 mg by mouth every 6 (six) hours as needed (pain).    03/11/2017 at 0327    Nadara Mustard, PharmD., MS Clinical Pharmacist Pager:  [phone] Thank you for allowing pharmacy to be part of this patients care team. 03/12/2017,9:10 AM

## 2017-03-12 NOTE — Progress Notes (Signed)
Subjective/Complaints: Pt asking about therapy given her episode of Afib yesterday We discussed her back pain- relieved by Kpad, and incisional pain relieved with ultram   ROS- negative for CP, SOB, Abd pain , N/V/D   Objective: Vital Signs: Blood pressure 130/60, pulse 74, temperature 97.8 F (36.6 C), temperature source Oral, resp. rate 20, height 5' 10"  (1.778 m), weight 78 kg (172 lb), SpO2 93 %. No results found. Results for orders placed or performed during the hospital encounter of 03/11/17 (from the past 72 hour(s))  Comprehensive metabolic panel     Status: Abnormal   Collection Time: 03/11/17 10:32 PM  Result Value Ref Range   Sodium 137 135 - 145 mmol/L   Potassium 4.2 3.5 - 5.1 mmol/L   Chloride 105 101 - 111 mmol/L   CO2 25 22 - 32 mmol/L   Glucose, Bld 102 (H) 65 - 99 mg/dL   BUN 11 6 - 20 mg/dL   Creatinine, Ser 0.72 0.44 - 1.00 mg/dL   Calcium 8.5 (L) 8.9 - 10.3 mg/dL   Total Protein 5.3 (L) 6.5 - 8.1 g/dL   Albumin 2.9 (L) 3.5 - 5.0 g/dL   AST 18 15 - 41 U/L   ALT 55 (H) 14 - 54 U/L   Alkaline Phosphatase 78 38 - 126 U/L   Total Bilirubin 0.6 0.3 - 1.2 mg/dL   GFR calc non Af Amer >60 >60 mL/min   GFR calc Af Amer >60 >60 mL/min    Comment: (NOTE) The eGFR has been calculated using the CKD EPI equation. This calculation has not been validated in all clinical situations. eGFR's persistently <60 mL/min signify possible Chronic Kidney Disease.    Anion gap 7 5 - 15     HEENT: normal Cardio: RRR and no murmur Resp: CTA B/L and unlabored GI: BS positive and Non tender Extremity:  Pulses positive and No Edema Skin:   Intact Neuro: Alert/Oriented, Normal Sensory and Abnormal FMC Ataxic/ dec FMC 4+ BUE and BLE Musc/Skel:  Normal Gen NAD   Assessment/Plan: 1. 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 which require 3+ hours per day of interdisciplinary therapy in a comprehensive inpatient rehab setting. Physiatrist is providing close team  supervision and 24 hour management of active medical problems listed below. Physiatrist and rehab team continue to assess barriers to discharge/monitor patient progress toward functional and medical goals. FIM:       Function - Toileting Toileting steps completed by patient: (P) Adjust clothing prior to toileting, Performs perineal hygiene Toileting Assistive Devices: (P) Grab bar or rail Assist level: (P) Touching or steadying assistance (Pt.75%)  Function - Toilet Transfers Assist level to toilet: (P) Touching or steadying assistance (Pt > 75%) Assist level from toilet: (P) Touching or steadying assistance (Pt > 75%)                       Medical Problem List and Plan: 1.  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 CIR PT, OT, SLP evals today 2.  DVT Prophylaxis/Anticoagulation: Pharmaceutical: Lovenox- cardiology to determine when anticoagulation should start 3. Pain Management:mainly opper back pain and incisional pain Will continue  ultram prn, warm Kpad for back 4. Mood: LCSW to follow for evaluation and support. 5. Neuropsych: This patient is not fully capable of making decisions on her own behalf. 6. Skin/Wound Care: Monitor wound daily for healing. Maintain adequate hydration and nutritional status 7. Fluids/Electrolytes/Nutrition: Monitor  I/O. Check lytes in am. 8. A fib s/p DCCV: Monitor HR bid. Continue amiodarone. Metoprolol resumed today--monitor for orthostatic symptoms.  Reassured pt that she can do therapy today Cardiology on consult, had episode of RVR yesterday pm  9. Anxiety disorder: Continue Xanax prn. 10. Seizure activity: On keppra bid for lifelong basis per neurology. 11. H/o Alcohol abuse: Has competed CIWA protocol.  12.  Hypoalb start prostat LOS (Days) 1 A FACE TO FACE EVALUATION WAS PERFORMED  Jeovani Weisenburger E 03/12/2017, 7:13 AM

## 2017-03-12 NOTE — Evaluation (Signed)
Physical Therapy Assessment and Plan  Patient Details  Name: Beth Austin MRN: [id number] Date of Birth: [birth year]  PT Diagnosis: Difficulty walking, Impaired cognition and Muscle weakness Rehab Potential: Good ELOS: 10-12 days   Today's Date: 03/12/2017 PT Individual Time: 1000-1100 PT Individual Time Calculation (min): 60 min    Problem List:  Patient Active Problem List   Diagnosis Date Noted  . 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 Seizures ([place])   . ETOH abuse   . Anxiety state   . Atrial fibrillation [facility])     Past Medical History:  Past Medical History:  Diagnosis Date  . Alcohol abuse   . Dvt femoral (deep venous thrombosis) (HCC)    after tibial fracture  . Dysrhythmia   . Generalized anxiety disorder with panic attacks   . GERD (gastroesophageal reflux disease)   . Hypertension   . PAF (paroxysmal atrial fibrillation) (HCC)    resolved with carotid massage in the past  . Right tibial fracture 1997   treated with cast   Past Surgical History:  Past Surgical History:  Procedure Laterality Date  . ASCENDING AORTIC ANEURYSM REPAIR W/ MECHANICAL AORTIC VALVE REPLACEMENT  02/26/2017    Assessment & Plan Clinical Impression: Beth Austin is a 71 year old female, with a past medical history significant for epilepsy.  She presented to [facility] with a type A aortic dissection on 02/26/17 after her flight from [place], [place] to [place] was emergently landed due to reportedly seizing on the plane.  Patient intubated 02/26/17-03/01/17.  Cardiology and neurology consulted and followed; MRI revealed acute or early subacute infarct.  Therapies initiated 03/03/17 with recommendations for intensive post acute therapies given her independence prior to.  Patient medically stable and [facility] 03/11/17. Patient transferred to CIR on 03/11/2017 .   Patient currently requires min with mobility secondary to muscle weakness, decreased cardiorespiratoy  endurance and decreased balance strategies.  Prior to hospitalization, patient was independent  with mobility and lived with Alone in a House home.  Home access is 10?Stairs to enter.  Patient will benefit from skilled PT intervention to maximize safe functional mobility, minimize fall risk and decrease caregiver burden for planned discharge home with 24 hour supervision.  Anticipate patient will benefit from follow up OP at discharge.  PT - End of Session Activity Tolerance: Tolerates 10 - 20 min activity with multiple rests Endurance Deficit: Yes Endurance Deficit Description: increased labored breathing with minimal actiivty  PT Assessment Rehab Potential (ACUTE/IP ONLY): Good PT Barriers to Discharge: Decreased caregiver support;Other (comments) PT Barriers to Discharge Comments: pt unsure of d/c plan at this point, indicates that she may not have someone with her 24/7 PT Patient demonstrates impairments in the following area(s): Balance;Endurance;Safety PT Transfers Functional Problem(s): Bed Mobility;Bed to Chair;Car;Furniture PT Locomotion Functional Problem(s): Stairs;Wheelchair Mobility;Ambulation PT Plan PT Intensity: Minimum of 1-2 x/day ,45 to 90 minutes PT Frequency: 5 out of 7 days PT Duration Estimated Length of Stay: 10-12 days PT Treatment/Interventions: Ambulation/gait training;Community reintegration;DME/adaptive equipment instruction;Neuromuscular re-education;Psychosocial support;Stair training;UE/LE Strength taining/ROM;UE/LE Coordination activities;Therapeutic Activities;Balance/vestibular training;Discharge planning;Pain management;Cognitive remediation/compensation;Functional mobility training;Patient/family education;Therapeutic Exercise PT Transfers Anticipated Outcome(s): supervision PT Locomotion Anticipated Outcome(s): supervision ambulatory PT Recommendation Recommendations for Other Services: Neuropsych consult (for cognition) Follow Up Recommendations:  Outpatient PT Patient destination: Home Equipment Recommended: None recommended by PT  Skilled Therapeutic Intervention No c/o pain, but pt anxious throughout session.  PT instructed pt in PT evaluation, role of therapy, goals of care, safety plan,  and estimated LOS.  Pt currently performing transfers, and ambulation with min assist overall.  Returned to room at end of session, positioned in bed with call bell in reach and needs met.   PT Evaluation Precautions/Restrictions Precautions Precautions: Sternal;Fall Precaution Comments: able to recall functional things she cannot do but unable to say "no push/lift" Restrictions Weight Bearing Restrictions: No RUE Weight Bearing: Non weight bearing LUE Weight Bearing: Non weight bearing Other Position/Activity Restrictions: sternal precautions Pain Pain Assessment Pain Assessment: No/denies pain Pain Score: 2  Home Living/Prior Functioning Home Living Available Help at Discharge: Family;Available 24 hours/day (per pt will only have intermittent assist) Type of Home: House Home Access: Stairs to enter [employer] of Steps: 10? 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 Vision/Perception  Geologist, engineering: Within Functional Limits Praxis Praxis: Intact  Cognition Overall Cognitive Status: Impaired/Different from baseline Arousal/Alertness: Awake/alert Orientation Level: Oriented X4 Attention: Sustained Sustained Attention: Appears  intact Memory: Impaired Memory Impairment: Decreased short term memory;Decreased recall of new information Decreased Short Term Memory: Functional basic Awareness: Impaired Awareness Impairment: Anticipatory impairment;Emergent impairment Problem Solving: Impaired Problem Solving Impairment: Functional basic Executive Function:  (all impaired ) Safety/Judgment: Impaired Sensation Sensation Light Touch: Appears Intact (LEs) Light Touch Impaired Details: Impaired RUE Stereognosis: Appears Intact Hot/Cold: Appears Intact Proprioception: Impaired Detail Proprioception Impaired Details: Impaired RUE Additional Comments: reports right hand does not feel like the left Coordination Gross Motor Movements are Fluid and Coordinated: Yes Fine Motor Movements are Fluid and Coordinated: No Finger Nose Finger Test: right hand slower and not as coordinated Motor  Motor Motor: Within Functional Limits Motor - Skilled Clinical Observations: generalized weakness and poor activity tolerance  Mobility Bed Mobility Bed Mobility: Sit to Supine Sit to Supine: 5: Supervision;HOB flat Transfers Transfers: Yes Sit to Stand: 4: Min assist Sit to Stand Details: Tactile cues for weight shifting Stand to Sit: 4: Min assist Stand to Sit Details (indicate cue type and reason): Tactile cues for weight shifting Locomotion  Ambulation Ambulation: Yes Ambulation/Gait Assistance: 4: Min assist Ambulation Distance (Feet): 40 Feet Assistive device: None Stairs / Additional Locomotion Stairs: Yes Stairs Assistance: 4: Min guard Stair Management Technique: Two rails Number of Stairs: 8 Wheelchair Mobility Wheelchair Mobility: No  Trunk/Postural Assessment  Cervical Assessment Cervical Assessment: Within Functional Limits Thoracic Assessment Thoracic Assessment: Within Functional Limits Lumbar Assessment Lumbar Assessment: Within Functional Limits Postural Control Postural Control: Within Functional  Limits  Balance Balance Balance Assessed: Yes Dynamic Sitting Balance Dynamic Sitting - Balance Support: During functional activity Dynamic Sitting - Level of Assistance: 5: Stand by assistance Static Standing Balance Static Standing - Balance Support: During functional activity Static Standing - Level of Assistance: 5: Stand by assistance Dynamic Standing Balance Dynamic Standing - Balance Support: During functional activity Dynamic Standing - Level of Assistance: 4: Min assist Extremity Assessment  RUE Assessment RUE Assessment: Exceptions to [facility] RUE Strength RUE Overall Strength: Deficits RUE Overall Strength Comments: Brunstrom VI ; slower coordination 3+/5 RUE Tone RUE Tone: Within Functional Limits;Modified Ashworth Modified Ashworth Scale for Grading Hypertonia RUE: No increase in muscle tone LUE Assessment LUE Assessment:  Within Functional Limits RLE Assessment RLE Assessment: Within Functional Limits (hip 3+/5, knee and ankle 4/5) LLE Assessment LLE Assessment: Within Functional Limits (3+/5 at hip, knee and hip 4/5)   See Function Navigator for Current Functional Status.   Refer to Care Plan for Long Term Goals  Recommendations for other services: Neuropsych  Discharge Criteria: Patient will be discharged from PT if patient refuses treatment 3 consecutive times without medical reason, if treatment goals not met, if there is a change in medical status, if patient makes no progress towards goals or if patient is discharged from hospital.  The above assessment, treatment plan, treatment alternatives and goals were discussed and mutually agreed upon: by patient and by family  Michel Santee 03/12/2017, 12:14 PM

## 2017-03-12 NOTE — Evaluation (Signed)
Speech Language Pathology Assessment and Plan  Patient Details  Name: Beth Austin MRN: [id number] Date of Birth: 1946-04-24  SLP Diagnosis: Cognitive Impairments  Rehab Potential: Good ELOS: 11-13 days     Today's Date: 03/12/2017 SLP Individual Time: 37-1530 SLP Individual Time Calculation (min): 55 min   Problem List:  Patient Active Problem List   Diagnosis Date Noted  . 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 Seizures ([place])   . ETOH abuse   . Anxiety state   . Atrial fibrillation [facility])    Past Medical History:  Past Medical History:  Diagnosis Date  . Alcohol abuse   . Dvt femoral (deep venous thrombosis) (HCC)    after tibial fracture  . Dysrhythmia   . Generalized anxiety disorder with panic attacks   . GERD (gastroesophageal reflux disease)   . Hypertension   . PAF (paroxysmal atrial fibrillation) (HCC)    resolved with carotid massage in the past  . Right tibial fracture 1997   treated with cast   Past Surgical History:  Past Surgical History:  Procedure Laterality Date  . ASCENDING AORTIC ANEURYSM REPAIR W/ MECHANICAL AORTIC VALVE REPLACEMENT  02/26/2017    Assessment / Plan / Recommendation Clinical Impression   Beth Austin is a 71 year old female who was emergently admitted to [facility] 02/26/2017 with seizure, pain radiating from chest to back and RLE due to aortic dissection. She was flight from [place] to [place], started seizing and plane has to be diverted to RDU.  She was intubated in ED, RLE noted to be cold and taken to OR emergently for repair of thoracoabdominal aortic dissection with Bental procedure and AVR by Dr. Maren Reamer. 2D echo with EF 40-45% with mild LVH, mild to moderately global LVD and mild MVR/TVR.  She continued to have seizure with right sided motor activity on 9/6 and neurology recommended increasing Keppra to 1000 mg bid. To remain on keppra lifelong due to family reports of episodes suggestive of epilepsy going back to 10  years. 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 She developed A fib/A flutter requiring DCCV 9/11 by Dr.  and continues on BB and amiodarone for rate control. Anxiety/ insomnia being managed with prn xanax. Fluid overload treated with diuresis. Pre op EF < 40%--blood pressures remain on low side--SBP< 100,  limiting ACE/ARB use.   SLP evaluation was completed on 03/12/2017 with the following results:  Pt presents with mild higher level executive functioning and memory deficits for complex tasks.  On evaluation it is difficult to determine whether these deficits are related to CVA or secondary to medication effects, poor sleep, and anxiety.  Pt was working full time as a professor and living independently prior to admission; as a result, pt would benefit from skilled ST while inpatient in order to maximize functional independence and reduce burden of care prior to discharge   Skilled Therapeutic Interventions          Cognitive-linguistic evaluation completed with results and recommendations reviewed with family.  Pt was able to recall 1 out of 3 words after a 5 minute delay, which improved to 3 out of 3 recall with min assist verbal cues.  She needed min assist verbal cues to recognize and correct errors when completing money management, medication management, and daily math subtests of the ALFA standardized cognitive assessment.  Discussed findings with pt and therapist's recommendations.  Pt in agreement with recommended plan of care.  Pt left with son at bedside and call bell within reach.      SLP Assessment  Patient will need skilled [place] [facility] Services during CIR admission    Recommendations  Recommendations for Other Services: Neuropsych consult Patient destination: Home Follow up Recommendations: Other (comment) (TBD) Equipment Recommended: None recommended by SLP    SLP Frequency 1 to 3 out of 7 days   SLP  Duration  SLP Intensity  SLP Treatment/Interventions 11-13 days   Minumum of 1-2 x/day, 30 to 90 minutes  Cognitive remediation/compensation;Cueing hierarchy;Environmental controls;Functional tasks;Patient/family education;Internal/external aids    Pain Pain Assessment Pain Assessment: No/denies pain Pain Score: 0-No pain  Prior Functioning Cognitive/Linguistic Baseline: Within functional limits Type of Home: House  Lives With: Alone Available Help at Discharge: Family;Available PRN/intermittently Vocation: Full time employment  Function:  Eating Eating                 Cognition Comprehension Comprehension assist level: Follows basic conversation/direction with extra time/assistive device  Expression   Expression assist level: Expresses basic 90% of the time/requires cueing < 10% of the time.  Social Interaction Social Interaction assist level: Interacts appropriately 90% of the time - Needs monitoring or encouragement for participation or interaction.  Problem Solving Problem solving assist level: Solves basic 75 - 89% of the time/requires cueing 10 - 24% of the time  Memory Memory assist level: Recognizes or recalls 75 - 89% of the time/requires cueing 10 - 24% of the time   Short Term Goals: Week 1: SLP Short Term Goal 1 (Week 1): Pt will complete semi-complex functional tasks with supervision cues for functional problem solving.  SLP Short Term Goal 2 (Week 1): Pt will recall semi-complex, new information wtih supervision cues for use of external aids.   SLP Short Term Goal 3 (Week 1): Pt will recognize and correct errors in the moment during functional tasks with supervision verbal cues.    Refer to Care Plan for Long Term Goals  Recommendations for other services: Neuropsych  Discharge Criteria: Patient will be discharged from SLP if patient refuses treatment 3 consecutive times without medical reason, if treatment goals not met, if there is a change in medical  status, if patient makes no progress towards goals or if patient is discharged from hospital.  The above assessment, treatment plan, treatment alternatives and goals were discussed and mutually agreed upon: by patient  Emilio Math 03/12/2017, 8:58 PM

## 2017-03-12 NOTE — Evaluation (Signed)
Occupational Therapy Assessment and Plan  Patient Details  Name: Beth Austin MRN: [id number] Date of Birth: 20-Jul-1945  OT Diagnosis: acute pain, cognitive deficits, hemiplegia affecting dominant side and muscle weakness (generalized) Rehab Potential: Rehab Potential (ACUTE ONLY): Good ELOS: ~11-13 days   Today's Date: 03/12/2017 OT Individual Time: 9242-6834 OT Individual Time Calculation (min): 70 min     Problem List:  Patient Active Problem List   Diagnosis Date Noted  . 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 Seizures ([place])   . ETOH abuse   . Anxiety state   . Atrial fibrillation [facility])     Past Medical History:  Past Medical History:  Diagnosis Date  . Alcohol abuse   . Dvt femoral (deep venous thrombosis) (HCC)    after tibial fracture  . Dysrhythmia   . Generalized anxiety disorder with panic attacks   . GERD (gastroesophageal reflux disease)   . Hypertension   . PAF (paroxysmal atrial fibrillation) (HCC)    resolved with carotid massage in the past  . Right tibial fracture 1997   treated with cast   Past Surgical History:  Past Surgical History:  Procedure Laterality Date  . ASCENDING AORTIC ANEURYSM REPAIR W/ MECHANICAL AORTIC VALVE REPLACEMENT  02/26/2017    Assessment & Plan Clinical Impression: Patient is a 71 y.o. year old female emergently admitted to [facility] 02/26/2017 with seizure, pain radiating from chest to back and RLE due to aortic dissection. She was flight from [place] to [place], started seizing and plane has to be diverted to RDU.  She was intubated in ED, RLE noted to be cold and taken to OR emergently for repair of thoracoabdominal aortic dissection with Bental procedure and AVR by Dr. Maren Reamer. 2D echo with EF 40-45% with mild LVH, mild to moderately global LVD and mild MVR/TVR.  She continued to have seizure with right sided motor activity on 9/6 and neurology recommended increasing Keppra to 1000 mg bid. To remain on keppra lifelong due to  family reports of episodes suggestive of epilepsy going back to 10 years. 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 She developed A fib/A flutter requiring DCCV 9/11 by Dr.  and continues on BB and amiodarone for rate control. Anxiety/ insomnia being managed with prn xanax. Fluid overload treated with diuresis. Pre op EF < 40%--blood pressures remain on low side--SBP< 100,  limiting ACE/ARB use.   Patient transferred to CIR on 03/11/2017 .    Patient currently requires min with basic self-care skills and cognitive and functional mobility  secondary to muscle weakness, decreased cardiorespiratoy endurance and decreased oxygen support, impaired timing and sequencing and decreased coordination, decreased awareness, decreased problem solving, decreased safety awareness and decreased memory and decreased standing balance, decreased postural control, hemiplegia, decreased balance strategies and difficulty maintaining precautions.  Prior to hospitalization, patient could complete ADL with independent .  Patient will benefit from skilled intervention to decrease level of assist with basic self-care skills and increase independence with basic self-care skills prior to discharge home with care partner.  Anticipate patient will require intermittent supervision and follow up outpatient.  OT - End of Session Activity Tolerance: Tolerates 10 - 20 min activity with multiple rests Endurance Deficit: Yes Endurance Deficit Description: increased labored breathing with minimal actiivty  OT Assessment Rehab Potential (ACUTE ONLY): Good OT Patient demonstrates impairments in the following area(s): Balance;Safety;Cognition;Endurance OT Basic ADL's Functional Problem(s): Grooming;Bathing;Dressing;Toileting OT Transfers Functional Problem(s): Toilet;Tub/Shower OT Additional Impairment(s): Fuctional Use  of Upper Extremity OT Plan OT Intensity: Minimum of 1-2 x/day, 45  to 90 minutes OT Frequency: 5 out of 7 days OT Duration/Estimated Length of Stay: ~11-13 days OT Treatment/Interventions: Balance/vestibular training;Discharge planning;Functional electrical stimulation;Pain management;Therapeutic Activities;Self Care/advanced ADL retraining;UE/LE Coordination activities;Therapeutic Exercise;Visual/perceptual remediation/compensation;Skin care/wound managment;Patient/family education;Functional mobility training;Disease mangement/prevention;Cognitive remediation/compensation;Community reintegration;DME/adaptive equipment instruction;Neuromuscular re-education;Psychosocial support;UE/LE Strength taining/ROM OT Self Feeding Anticipated Outcome(s): n/a OT Basic Self-Care Anticipated Outcome(s): supervision OT Toileting Anticipated Outcome(s): mod I  OT Bathroom Transfers Anticipated Outcome(s): mod I  OT Recommendation Recommendations for Other Services: Neuropsych consult Patient destination: Home Follow Up Recommendations: Outpatient OT Equipment Recommended: To be determined   Skilled Therapeutic Intervention 1:1 OT eval initated with OT goals, purpose and role discussed with pt, pt's daughter and pt's partner. Self care retraining at shower level with focus on functional ambulation and transfers with min A with mod cuing to maintain sternal precautions. PT with significant decr activity tolerance and cardiopulmomary endurance requiring frequent rest breaks throughout session. After shower pt with 2/4 dyspnea with labored breathing. (pulse ox does not read well on her finger due to gel nail polish). Pt with difficulty with dressing the right side requiring A due to decr coordination of hand and LE. Pt needed to complete grooming at sink in sitting and required A to dry hair due to fatigue.   OT Evaluation Precautions/Restrictions  Precautions Precautions: Sternal;Fall Restrictions Weight Bearing Restrictions: No Other Position/Activity Restrictions: sternal  precautions General Chart Reviewed: Yes Family/Caregiver Present: Yes (daughter and partnerRichardson Landry) Vital Signs   Pain Pain Assessment Pain Assessment: No/denies pain Pain Score: 2  Pain Type: Chronic pain Pain Location: Back Pain Orientation: Mid Pain Descriptors / Indicators: Aching Pain Onset: On-going Patients Stated Pain Goal: 3 Pain Intervention(s): Medication (See eMAR) Home Living/Prior [place] expects to be discharged to:: Private residence Living Arrangements: Alone Available Help at Discharge: Family, Available 24 hours/day Type of Home: Apartment Home Access: Elevator Bathroom Shower/Tub: Chiropodist: Standard Additional Comments: Patient worked out with a Clinical research associate with focus on balance prior to admission; pt will d/c to family member's home in [place]  Lives With: Alone ADL ADL ADL Comments: see functional navigator Vision Baseline Vision/History: Wears glasses Wears Glasses: At all times Patient Visual Report: No change from baseline Vision Assessment?: No apparent visual deficits Perception  Perception: Within Functional Limits Praxis Praxis: Intact Cognition Overall Cognitive Status: Impaired/Different from baseline Arousal/Alertness: Awake/alert Orientation Level: Person;Place;Situation Person: Oriented Place: Oriented Situation: Oriented Memory: Impaired Memory Impairment: Decreased short term memory;Decreased recall of new information Decreased Short Term Memory: Functional basic Attention: Sustained Sustained Attention: Appears intact Awareness: Impaired Awareness Impairment: Intellectual impairment;Emergent impairment Problem Solving: Impaired Problem Solving Impairment: Functional basic Executive Function:  (all impaired ) Safety/Judgment: Impaired  Recall month September Year 2018 Day of the week Tuesday Immediate recall of sock, blue, bed onnly recall of sock with  cue Sensation Sensation Light Touch: Impaired Detail Light Touch Impaired Details: Impaired RUE Stereognosis: Appears Intact Hot/Cold: Appears Intact Proprioception: Impaired Detail Proprioception Impaired Details: Impaired RUE Additional Comments: reports right hand does not feel like the left Coordination Gross Motor Movements are Fluid and Coordinated: Yes Fine Motor Movements are Fluid and Coordinated: No Finger Nose Finger Test: right hand slower and not as coordinated Motor  Motor Motor - Skilled Clinical Observations: generalized weakness and poor activity tolerance Mobility  Transfers Transfers: Sit to Stand;Stand to Sit Sit to Stand: 4: Min assist Stand to Sit: 4: Min assist  Trunk/Postural Assessment  Cervical Assessment Cervical Assessment: Within Functional Limits Thoracic Assessment Thoracic Assessment: Within Functional Limits Lumbar Assessment Lumbar Assessment: Within Functional Limits Postural Control Postural Control: Within Functional Limits  Balance Balance Balance Assessed: Yes Dynamic Sitting Balance Dynamic Sitting - Balance Support: During functional activity Dynamic Sitting - Level of Assistance: 5: Stand by assistance Static Standing Balance Static Standing - Balance Support: During functional activity Static Standing - Level of Assistance: 5: Stand by assistance Dynamic Standing Balance Dynamic Standing - Level of Assistance: 4: Min assist Extremity/Trunk Assessment RUE Assessment RUE Assessment: Exceptions to [facility] RUE Strength RUE Overall Strength: Deficits RUE Overall Strength Comments: Brunstrom VI ; slower coordination 3+/5 RUE Tone RUE Tone: Within Functional Limits;Modified Ashworth Modified Ashworth Scale for Grading Hypertonia RUE: No increase in muscle tone LUE Assessment LUE Assessment: Within Functional Limits   See Function Navigator for Current Functional Status.   Refer to Care Plan for Long Term Goals  Recommendations  for other services: Neuropsych   Discharge Criteria: Patient will be discharged from OT if patient refuses treatment 3 consecutive times without medical reason, if treatment goals not met, if there is a change in medical status, if patient makes no progress towards goals or if patient is discharged from hospital.  The above assessment, treatment plan, treatment alternatives and goals were discussed and mutually agreed upon: by patient  Nicoletta Ba 03/12/2017, 10:02 AM

## 2017-03-13 ENCOUNTER — Inpatient Hospital Stay (HOSPITAL_COMMUNITY): Payer: Managed Care, Other (non HMO) | Admitting: Speech Pathology

## 2017-03-13 ENCOUNTER — Encounter (HOSPITAL_COMMUNITY): Payer: Managed Care, Other (non HMO) | Admitting: Psychology

## 2017-03-13 ENCOUNTER — Inpatient Hospital Stay (HOSPITAL_COMMUNITY): Payer: Managed Care, Other (non HMO) | Admitting: Occupational Therapy

## 2017-03-13 ENCOUNTER — Inpatient Hospital Stay (HOSPITAL_COMMUNITY): Payer: Managed Care, Other (non HMO) | Admitting: *Deleted

## 2017-03-13 ENCOUNTER — Inpatient Hospital Stay (HOSPITAL_COMMUNITY): Payer: Managed Care, Other (non HMO) | Admitting: Physical Therapy

## 2017-03-13 DIAGNOSIS — F410 Panic disorder [episodic paroxysmal anxiety] without agoraphobia: Secondary | ICD-10-CM

## 2017-03-13 DIAGNOSIS — G8191 Hemiplegia, unspecified affecting right dominant side: Secondary | ICD-10-CM

## 2017-03-13 DIAGNOSIS — I6319 Cerebral infarction due to embolism of other precerebral artery: Secondary | ICD-10-CM

## 2017-03-13 MED ORDER — MIRTAZAPINE 15 MG PO TBDP
15.0000 mg | ORAL_TABLET | Freq: Every day | ORAL | Status: DC
  Administered 2017-03-13: 15 mg via ORAL
  Filled 2017-03-13 (×2): qty 1

## 2017-03-13 MED ORDER — APIXABAN 5 MG PO TABS
5.0000 mg | ORAL_TABLET | Freq: Two times a day (BID) | ORAL | Status: DC
  Administered 2017-03-13 – 2017-03-21 (×16): 5 mg via ORAL
  Filled 2017-03-13 (×16): qty 1

## 2017-03-13 MED ORDER — AMIODARONE HCL 200 MG PO TABS
200.0000 mg | ORAL_TABLET | Freq: Two times a day (BID) | ORAL | Status: DC
  Administered 2017-03-13 – 2017-03-18 (×10): 200 mg via ORAL
  Filled 2017-03-13 (×10): qty 1

## 2017-03-13 NOTE — Progress Notes (Addendum)
Subjective/Complaints:  Pt stressed out by building home, closing in 2 mo, travels a lot for work  ROS- negative for CP, SOB, Abd pain , N/V/D   Objective: Vital Signs: Blood pressure (!) 132/59, pulse 73, temperature 98.4 F (36.9 C), temperature source Oral, resp. rate (!) 26, height 5' 10"  (1.778 m), weight 78 kg (172 lb), SpO2 96 %. No results found. Results for orders placed or performed during the hospital encounter of 03/11/17 (from the past 72 hour(s))  Comprehensive metabolic panel     Status: Abnormal   Collection Time: 03/11/17 10:32 PM  Result Value Ref Range   Sodium 137 135 - 145 mmol/L   Potassium 4.2 3.5 - 5.1 mmol/L   Chloride 105 101 - 111 mmol/L   CO2 25 22 - 32 mmol/L   Glucose, Bld 102 (H) 65 - 99 mg/dL   BUN 11 6 - 20 mg/dL   Creatinine, Ser 0.72 0.44 - 1.00 mg/dL   Calcium 8.5 (L) 8.9 - 10.3 mg/dL   Total Protein 5.3 (L) 6.5 - 8.1 g/dL   Albumin 2.9 (L) 3.5 - 5.0 g/dL   AST 18 15 - 41 U/L   ALT 55 (H) 14 - 54 U/L   Alkaline Phosphatase 78 38 - 126 U/L   Total Bilirubin 0.6 0.3 - 1.2 mg/dL   GFR calc non Af Amer >60 >60 mL/min   GFR calc Af Amer >60 >60 mL/min    Comment: (NOTE) The eGFR has been calculated using the CKD EPI equation. This calculation has not been validated in all clinical situations. eGFR's persistently <60 mL/min signify possible Chronic Kidney Disease.    Anion gap 7 5 - 15  CBC WITH DIFFERENTIAL     Status: Abnormal   Collection Time: 03/12/17  6:56 AM  Result Value Ref Range   WBC 10.1 4.0 - 10.5 K/uL   RBC 2.77 (L) 3.87 - 5.11 MIL/uL   Hemoglobin 8.0 (L) 12.0 - 15.0 g/dL   HCT 25.8 (L) 36.0 - 46.0 %   MCV 93.1 78.0 - 100.0 fL   MCH 28.9 26.0 - 34.0 pg   MCHC 31.0 30.0 - 36.0 g/dL   RDW 14.0 11.5 - 15.5 %   Platelets 366 150 - 400 K/uL   Neutrophils Relative % 77 %   Neutro Abs 7.7 1.7 - 7.7 K/uL   Lymphocytes Relative 15 %   Lymphs Abs 1.5 0.7 - 4.0 K/uL   Monocytes Relative 8 %   Monocytes Absolute 0.8 0.1 - 1.0  K/uL   Eosinophils Relative 0 %   Eosinophils Absolute 0.0 0.0 - 0.7 K/uL   Basophils Relative 0 %   Basophils Absolute 0.0 0.0 - 0.1 K/uL  Comprehensive metabolic panel     Status: Abnormal   Collection Time: 03/12/17  6:56 AM  Result Value Ref Range   Sodium 137 135 - 145 mmol/L   Potassium 3.8 3.5 - 5.1 mmol/L   Chloride 105 101 - 111 mmol/L   CO2 25 22 - 32 mmol/L   Glucose, Bld 96 65 - 99 mg/dL   BUN 9 6 - 20 mg/dL   Creatinine, Ser 0.80 0.44 - 1.00 mg/dL   Calcium 8.6 (L) 8.9 - 10.3 mg/dL   Total Protein 5.7 (L) 6.5 - 8.1 g/dL   Albumin 3.0 (L) 3.5 - 5.0 g/dL   AST 20 15 - 41 U/L   ALT 54 14 - 54 U/L   Alkaline Phosphatase 89 38 - 126 U/L  Total Bilirubin 0.6 0.3 - 1.2 mg/dL   GFR calc non Af Amer >60 >60 mL/min   GFR calc Af Amer >60 >60 mL/min    Comment: (NOTE) The eGFR has been calculated using the CKD EPI equation. This calculation has not been validated in all clinical situations. eGFR's persistently <60 mL/min signify possible Chronic Kidney Disease.    Anion gap 7 5 - 15     HEENT: normal Cardio: RRR and no murmur Resp: CTA B/L and unlabored GI: BS positive and Non tender Extremity:  Pulses positive and No Edema Skin:   Intact Neuro: Alert/Oriented, Normal Sensory and Abnormal FMC Ataxic/ dec FMC 4+ BUE and BLE Musc/Skel:  Normal Gen NAD   Assessment/Plan: 1. 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 which require 3+ hours per day of interdisciplinary therapy in a comprehensive inpatient rehab setting. Physiatrist is providing close team supervision and 24 hour management of active medical problems listed below. Physiatrist and rehab team continue to assess barriers to discharge/monitor patient progress toward functional and medical goals. FIM: Function - Bathing Position: Shower Body parts bathed by patient: Right arm, Left arm, Chest, Abdomen, Front perineal area, Buttocks, Right upper leg, Left upper leg, Right lower leg, Left lower  leg Body parts bathed by helper: Back Assist Level: Touching or steadying assistance(Pt > 75%)  Function- Upper Body Dressing/Undressing What is the patient wearing?: Pull over shirt/dress Pull over shirt/dress - Perfomed by patient: Thread/unthread right sleeve, Thread/unthread left sleeve Pull over shirt/dress - Perfomed by helper: Put head through opening, Pull shirt over trunk Assist Level: Supervision or verbal cues, Set up Set up : To obtain clothing/put away Function - Lower Body Dressing/Undressing What is the patient wearing?: Underwear, Pants, Shoes Position: Wheelchair/chair at sink Underwear - Performed by patient: Thread/unthread left underwear leg, Pull underwear up/down Underwear - Performed by helper: Thread/unthread right underwear leg Pants- Performed by patient: Thread/unthread left pants leg, Pull pants up/down Pants- Performed by helper: Thread/unthread right pants leg Shoes - Performed by patient: Don/doff right shoe, Don/doff left shoe Assist for footwear: Supervision/touching assist Assist for lower body dressing: Touching or steadying assistance (Pt > 75%)  Function - Toileting Toileting steps completed by patient: Adjust clothing prior to toileting, Performs perineal hygiene, Adjust clothing after toileting Toileting Assistive Devices: Grab bar or rail Assist level: Supervision or verbal cues  Function - Toilet Transfers Assist level to toilet: Touching or steadying assistance (Pt > 75%) Assist level from toilet: Touching or steadying assistance (Pt > 75%)  Function - Chair/bed transfer Chair/bed transfer method: Ambulatory Chair/bed transfer assist level: Touching or steadying assistance (Pt > 75%) Chair/bed transfer assistive device:  (heart pillow) Chair/bed transfer details: Verbal cues for precautions/safety  Function - Locomotion: Wheelchair Will patient use wheelchair at discharge?: No Function - Locomotion: Ambulation Assistive device: No  device Max distance: 40 Assist level: Touching or steadying assistance (Pt > 75%) Assist level: Touching or steadying assistance (Pt > 75%) Walk 50 feet with 2 turns activity did not occur: Safety/medical concerns Walk 150 feet activity did not occur: Safety/medical concerns Walk 10 feet on uneven surfaces activity did not occur: Safety/medical concerns  Function - Comprehension Comprehension: Auditory Comprehension assist level: Follows basic conversation/direction with extra time/assistive device  Function - Expression Expression: Verbal Expression assist level: Expresses basic 90% of the time/requires cueing < 10% of the time.  Function - Social Interaction Social Interaction assist level: Interacts appropriately 90% of the time - Needs monitoring or encouragement for participation or interaction.  Function - Problem Solving Problem solving assist level: Solves basic 75 - 89% of the time/requires cueing 10 - 24% of the time  Function - Memory Memory assist level: Recognizes or recalls 75 - 89% of the time/requires cueing 10 - 24% of the time Patient normally able to recall (first 3 days only): Current season, Location of own room, Staff names and faces, That he or she is in a hospital  Medical Problem List and Plan: 1.  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 CIR PT, OT, SLP Team conference today please see physician documentation under team conference tab, met with team face-to-face to discuss problems,progress, and goals. Formulized individual treatment plan based on medical history, underlying problem and comorbidities. 2.  DVT Prophylaxis/Anticoagulation: Pharmaceutical: Lovenox- cardiology to determine when anticoagulation should start, ETOH  Fall risk may play into decision 3. Pain Management:mainly opper back pain and incisional pain Will continue  ultram prn, warm Kpad for back 4. Mood: LCSW  to follow for evaluation and support. 5. Neuropsych: This patient is not fully capable of making decisions on her own behalf. 6. Skin/Wound Care: Monitor wound daily for healing. Maintain adequate hydration and nutritional status 7. Fluids/Electrolytes/Nutrition: Monitor I/O. Check lytes in am. 8. A fib s/p DCCV: Monitor HR bid. Continue amiodarone. Metoprolol resumed today--monitor for orthostatic symptoms.  Reassured pt that she can do therapy today Cardiology on consult, had episode of RVR 9/17  9. Anxiety disorder: Continue Xanax prn.add remeron qhs 10. Seizure activity: On keppra bid for lifelong basis per neurology. 11. H/o Alcohol abuse: Has competed CIWA protocol.Pt states she drank in evenings to relax, "didn't realize how much I depended on it"  12.  Hypoalb start prostat- discussed rec to increase protein intake 13.  ABLA monitor Hgb LOS (Days) 2 A FACE TO FACE EVALUATION WAS PERFORMED  Beth Austin E 03/13/2017, 8:59 AM

## 2017-03-13 NOTE — Progress Notes (Signed)
Speech Language Pathology Daily Session Note  Patient Details  Name: Beth Austin MRN: [id number] Date of Birth: 06/21/1946  Today's Date: 03/13/2017 SLP Individual Time: 4098-1191 SLP Individual Time Calculation (min): 55 min  Short Term Goals: Week 1: SLP Short Term Goal 1 (Week 1): Pt will complete semi-complex functional tasks with supervision cues for functional problem solving.  SLP Short Term Goal 2 (Week 1): Pt will recall semi-complex, new information wtih supervision cues for use of external aids.   SLP Short Term Goal 3 (Week 1): Pt will recognize and correct errors in the moment during functional tasks with supervision verbal cues.    Skilled Therapeutic Interventions: Skilled treatment session focused on cognitive goals. SLP facilitated session by providing Mod A verbal cues for recall of her current medications and their functions and supervision verbal cues for problem solving while organizing a BID pill box. Patient independently requested to use the bathroom and required Mod-Max A verbal cues for use of sternal precautions and to not get up from commode without SLP present. Patient appeared lethargic throughout session and verbalized anxiety. Patient left upright in recliner with all needs within reach. Continue with current plan of care.      Function:   Cognition Comprehension Comprehension assist level: Follows basic conversation/direction with extra time/assistive device  Expression   Expression assist level: Expresses basic 90% of the time/requires cueing < 10% of the time.  Social Interaction Social Interaction assist level: Interacts appropriately 90% of the time - Needs monitoring or encouragement for participation or interaction.  Problem Solving Problem solving assist level: Solves basic 75 - 89% of the time/requires cueing 10 - 24% of the time  Memory Memory assist level: Recognizes or recalls 75 - 89% of the time/requires cueing 10 - 24% of the time     Pain No/Denies Pain   Therapy/Group: Individual Therapy  Beth Austin 03/13/2017, 9:54 AM

## 2017-03-13 NOTE — Progress Notes (Signed)
Occupational Therapy Session Note  Patient Details  Name: Beth Austin MRN: [id number] Date of Birth: 1946-02-02  Today's Date: 03/13/2017 OT Individual Time: 1100-1200 OT Individual Time Calculation (min): 60 min    Short Term Goals: Week 1:     Skilled Therapeutic Interventions/Progress Updates:    1:1 SElf care retraining at shower level with focus on functional ambulation around room without AD with steadying A. PT still requires steadying A and reports feeling unsteady when up and on her feet. Pt reports fatigue and difficulty sleeping. Pt required A to open bottles of soap but able to wash herself sit to stand with close supervision. Pt occasionally required VC for proper hand placement for sit to stands to maintain sternal precautions. Pt with increased success with threading LB clothing today and coordination of right UE/LE. PT able to complete tooth brushing and drying hair at sink in sitting with setup.  Continued to discuss d/c planning.   LEft resting in the recliner at end of session.   2nd session 14:30-15:00 (30 min) 1:1 focus on activity tolerance with ambulating in the hallway- could only make it to the RN station <100 feet before requesting to sit.  Propelled w/c 200 feet with bilateral feet on the way outside.  Taken Outside for encouragement and to continue d/c planning discussion with pt and pt's partner Brett Canales. Left resting in the recliner with family.  Therapy Documentation Precautions:  Precautions Precautions: Sternal, Fall Precaution Comments: able to recall functional things she cannot do but unable to say "no push/lift" Restrictions Weight Bearing Restrictions: Yes (sternal precautions) RUE Weight Bearing: Non weight bearing (sternal precautions) LUE Weight Bearing: Non weight bearing (sternal precautions) Other Position/Activity Restrictions: sternal precautions Pain:  no c/o pain just fatigue in both sessions  ADL: ADL ADL Comments: see functional  navigator  See Function Navigator for Current Functional Status.   Therapy/Group: Individual Therapy  Roney Mans [facility] 03/13/2017, 7:06 PM

## 2017-03-13 NOTE — Progress Notes (Signed)
Physical Therapy Session Note  Patient Details  Name: Beth Austin MRN: [id number] Date of Birth: 1946/01/07  Today's Date: 03/13/2017 PT Individual Time: 0900-1000 PT Individual Time Calculation (min): 60 min   Short Term Goals: Week 1:  PT Short Term Goal 1 (Week 1): Pt will complete DGI balance assessment PT Short Term Goal 2 (Week 1): Pt will negotiate 12 steps with min assist PT Short Term Goal 3 (Week 1): Pt will recall 3/3 sternal precautions without cues PT Short Term Goal 4 (Week 1): Pt will ambulate 100' with min assist   Skilled Therapeutic Interventions/Progress Updates:    no c/o pain following pain medicine, seems less anxious today but reports groggy from anxiety medication early this AM.  Session focus on activity tolerance and functional mobility.    Pt transfers throughout session with heart pillow and supervision, min cues for squaring up to surface before sitting down.  Nustep x10 minutes with BLEs for strengthening and cardiovascular exercise.  PT instructed pt in therex for balance 3 passes in // bars for forward/retro/side stepping, 15 reps of minisquats and heel/toe raises, and 2x5 sit<>stands without UE support.  Gait x100' with heart pillow for support, min assist for steadiness.  Pt c/o worsening dizziness with increased gait distance, vitals WNL.  Pt returned to room at end of session and positioned in recliner with call bell in reach and needs met.   Therapy Documentation Precautions:  Precautions Precautions: Sternal, Fall Precaution Comments: able to recall functional things she cannot do but unable to say "no push/lift" Restrictions Weight Bearing Restrictions: Yes RUE Weight Bearing: Non weight bearing (sternal precautions) LUE Weight Bearing: Non weight bearing (sternal precautions) Other Position/Activity Restrictions: sternal precautions   See Function Navigator for Current Functional Status.   Therapy/Group: Individual Therapy  Michel Santee 03/13/2017, 9:38 AM

## 2017-03-13 NOTE — Progress Notes (Addendum)
DAILY PROGRESS NOTE   Patient Name: Beth Austin Date of Encounter: 03/13/2017  Hospital Problem List   Active Problems:   Stroke (cerebrum) ([place])   Seizures (HCC)   ETOH abuse   Anxiety state   Atrial fibrillation [facility])    Chief Complaint   Anxious, somewhat weak  Subjective   Follow-up today for recent a-fib. Does not appreciate a recurrence. She has been on amiodarone 200 mg BID since discharge from [facility] in early September. She had Type A aortic dissection and had Bental procedure with a 27 mm bioprosthetic valve. She denies chest pain or dyspnea. Had afib 2 days ago, but spontaneously converted back to sinus. She is not anticoagulated. Noted to have had a stroke (possibly from the dissection) based on an MRI HEAD performed at [facility]. Her CHADSVASC score is at least 4.  Objective   Vitals:   03/12/17 0500 03/12/17 1652 03/12/17 2025 03/13/17 0650  BP:  132/63 (!) 103/49 (!) 132/59  Pulse:  (!) 45 73 73  Resp:  19  (!) 26  Temp:  98.1 F (36.7 C)  98.4 F (36.9 C)  TempSrc:  Oral  Oral  SpO2: 93% 90%  96%  Weight: 172 lb (78 kg)     Height:       No intake or output data in the 24 hours ending 03/13/17 1417 Filed Weights   03/11/17 1620 03/12/17 0500  Weight: 188 lb (85.3 kg) 172 lb (78 kg)    Physical Exam   General appearance: alert and no distress Neck: no carotid bruit, no JVD and thyroid not enlarged, symmetric, no tenderness/mass/nodules Lungs: clear to auscultation bilaterally Heart: regular rate and rhythm, S1, S2 normal, no murmur, click, rub or gallop Abdomen: soft, non-tender; bowel sounds normal; no masses,  no organomegaly Extremities: extremities normal, atraumatic, no cyanosis or edema Pulses: 2+ and symmetric Skin: Skin color, texture, turgor normal. No rashes or lesions Neurologic: Grossly normal Psych: Pleasant  Inpatient Medications    Scheduled Meds: . amiodarone  200 mg Oral BID  . aspirin EC  325 mg Oral Daily  . atorvastatin  40  mg Oral q1800  . enoxaparin (LOVENOX) injection  40 mg Subcutaneous Q24H  . feeding supplement (PRO-STAT SUGAR FREE 64)  30 mL Oral BID  . hydrocortisone cream   Topical BID  . levETIRAcetam  1,000 mg Oral BID  . metoprolol tartrate  12.5 mg Oral BID  . mirtazapine  15 mg Oral QHS  . pantoprazole  20 mg Oral Daily    Continuous Infusions:   PRN Meds: acetaminophen, ALPRAZolam, alum & mag hydroxide-simeth, bisacodyl, diphenhydrAMINE, guaiFENesin-dextromethorphan, ibuprofen, polyethylene glycol, prochlorperazine **OR** prochlorperazine **OR** prochlorperazine, senna-docusate, sodium phosphate, traMADol, traZODone   Labs   Results for orders placed or performed during the hospital encounter of 03/11/17 (from the past 48 hour(s))  Comprehensive metabolic panel     Status: Abnormal   Collection Time: 03/11/17 10:32 PM  Result Value Ref Range   Sodium 137 135 - 145 mmol/L   Potassium 4.2 3.5 - 5.1 mmol/L   Chloride 105 101 - 111 mmol/L   CO2 25 22 - 32 mmol/L   Glucose, Bld 102 (H) 65 - 99 mg/dL   BUN 11 6 - 20 mg/dL   Creatinine, Ser 0.72 0.44 - 1.00 mg/dL   Calcium 8.5 (L) 8.9 - 10.3 mg/dL   Total Protein 5.3 (L) 6.5 - 8.1 g/dL   Albumin 2.9 (L) 3.5 - 5.0 g/dL   AST 18  15 - 41 U/L   ALT 55 (H) 14 - 54 U/L   Alkaline Phosphatase 78 38 - 126 U/L   Total Bilirubin 0.6 0.3 - 1.2 mg/dL   GFR calc non Af Amer >60 >60 mL/min   GFR calc Af Amer >60 >60 mL/min    Comment: (NOTE) The eGFR has been calculated using the CKD EPI equation. This calculation has not been validated in all clinical situations. eGFR's persistently <60 mL/min signify possible Chronic Kidney Disease.    Anion gap 7 5 - 15  CBC WITH DIFFERENTIAL     Status: Abnormal   Collection Time: 03/12/17  6:56 AM  Result Value Ref Range   WBC 10.1 4.0 - 10.5 K/uL   RBC 2.77 (L) 3.87 - 5.11 MIL/uL   Hemoglobin 8.0 (L) 12.0 - 15.0 g/dL   HCT 25.8 (L) 36.0 - 46.0 %   MCV 93.1 78.0 - 100.0 fL   MCH 28.9 26.0 - 34.0 pg    MCHC 31.0 30.0 - 36.0 g/dL   RDW 14.0 11.5 - 15.5 %   Platelets 366 150 - 400 K/uL   Neutrophils Relative % 77 %   Neutro Abs 7.7 1.7 - 7.7 K/uL   Lymphocytes Relative 15 %   Lymphs Abs 1.5 0.7 - 4.0 K/uL   Monocytes Relative 8 %   Monocytes Absolute 0.8 0.1 - 1.0 K/uL   Eosinophils Relative 0 %   Eosinophils Absolute 0.0 0.0 - 0.7 K/uL   Basophils Relative 0 %   Basophils Absolute 0.0 0.0 - 0.1 K/uL  Comprehensive metabolic panel     Status: Abnormal   Collection Time: 03/12/17  6:56 AM  Result Value Ref Range   Sodium 137 135 - 145 mmol/L   Potassium 3.8 3.5 - 5.1 mmol/L   Chloride 105 101 - 111 mmol/L   CO2 25 22 - 32 mmol/L   Glucose, Bld 96 65 - 99 mg/dL   BUN 9 6 - 20 mg/dL   Creatinine, Ser 0.80 0.44 - 1.00 mg/dL   Calcium 8.6 (L) 8.9 - 10.3 mg/dL   Total Protein 5.7 (L) 6.5 - 8.1 g/dL   Albumin 3.0 (L) 3.5 - 5.0 g/dL   AST 20 15 - 41 U/L   ALT 54 14 - 54 U/L   Alkaline Phosphatase 89 38 - 126 U/L   Total Bilirubin 0.6 0.3 - 1.2 mg/dL   GFR calc non Af Amer >60 >60 mL/min   GFR calc Af Amer >60 >60 mL/min    Comment: (NOTE) The eGFR has been calculated using the CKD EPI equation. This calculation has not been validated in all clinical situations. eGFR's persistently <60 mL/min signify possible Chronic Kidney Disease.    Anion gap 7 5 - 15    ECG   N/A  Telemetry   N/A  Radiology    No results found.  Cardiac Studies   N/A  Assessment   1. Active Problems: 2.   Stroke (cerebrum) (HCC) 3.   Seizures ([place]) 4.   ETOH abuse 5.   Anxiety state 6.   Atrial fibrillation ([place]) 7.   Plan   1. She has had recurrent PAF, more than 1 month out from Type A dissection. She has been on amiodarone and is not anticoagulated. CHADSVASC score of 4. Would recommend starting Eliquis 5 mg BID. Should continue amiodarone 200 mg BID this week and decrease to 200 mg daily next Monday. Can discontinue aspirin. I will be happy to see her  in follow-up in the office in  about 1 month. Our office will reach out to her for a follow-up appointment.  Will sign-off. Please call with further questions.  Time Spent Directly with Patient:  I have spent a total of 25 minutes with the patient reviewing hospital notes, telemetry, EKGs, labs and examining the patient as well as establishing an assessment and plan that was discussed personally with the patient. > 50% of time was spent in direct patient care.  Length of Stay:  LOS: 2 days   Pixie Casino, MD, [place]  Attending Cardiologist  Direct Dial: [phone]  Fax: [phone]  Website:  [email or web address] 03/13/2017, 2:17 PM

## 2017-03-13 NOTE — Consult Note (Signed)
Neuropsychological Consultation   Patient:   Beth Austin   DOB:   1946/02/15  MR Number:  [id number]  Location:  [facility] [address] [facility] [id number] [place] [address] Dept: [phone] Loc: [phone]           Date of Service:   03/13/2017  Start Time:   1 PM End Time:   2 PM  Provider/Observer:  Arley Phenix, Psy.D.       Clinical Neuropsychologist       Billing Code/Service: [phone] 4 Units  Chief Complaint:    Beth Austin is a 71 year old female who was initially admitted to [facility] on 02/26/2017 following seizure, pain radiating from chest and back and right lower extremity due to aortic dissection. The patient had been on a flight from [place] to [place]. The patient is reported to of started seizing on the plain and the plan had to be diverted to RDU. The patient was treated initially in the emergency department and then received vascular surgery for the aortic dissection. The patient did continue to have seizure primarily on right sided motor activity on 02/28/2017. She was placed on Keppra. The patient was later transported for comprehensive rehabilitation program at [facility] health. There are reports of family members that the patient had some type of seizures as much is 10 years ago and they were described today to be similar to absence seizures and/or temporal lobe seizures. 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   The patient had been returning from [place] after what she described as a tumultuous situation with her daughter. Patient reports that she was very upset after leaving [place] and developed panic attacks on the airplane. The patient continues to verbally blame her daughter for the medical event she experienced. However, the patient does acknowledge that she has had panic attacks in the past.  She reports that she stopped having them she began drinking a bottle of wine every night. The patient denies that she had a substance abuse problem and reports that she drank this much alcohol every day is both medication for her panic attacks which have been stopped by this alcohol consumption as well as "just something people do in [place]".  The patient was very upset today when both myself as well as one of her treating physicians told her that she should not return to consuming alcohol and that it could be very detrimental to the effectiveness of her Keppra as well as increase her seizure risk and enough itself.   Reason for Service:  The patient was referred for neuropsychological consult due to her ongoing emotional distress including significant anxiety, anger, reports of panic attacks, inability to sleep at night, and general frustration/anger over all of the events that led to her being in the rehabilitation program. The patient did not voice any frustrations directly about the rehabilitation program but the events that led up to her vascular events. Below is the history of present illness for the current admission.  HPI:  Beth Austin is a 71 year old female who was emergently admitted to [facility] 02/26/2017 with seizure, pain radiating from chest to back and RLE due to aortic dissection. She was flight from [place] to [place], started seizing and plane has to be diverted to RDU.  She was intubated in ED, RLE noted to be cold and taken  to OR emergently for repair of thoracoabdominal aortic dissection with Bental procedure and AVR by Dr. Nolon Rod. 2D echo with EF 40-45% with mild LVH, mild to moderately global LVD and mild MVR/TVR.  She continued to have seizure with right sided motor activity on 9/6 and neurology recommended increasing Keppra to 1000 mg bid. To remain on keppra lifelong due to family reports of episodes suggestive of epilepsy going back to 10 years. Follow up MRI brain 9/13  revealing acute/subacute infarct in left posterior limb internal capsule. She developed A fib/A flutter requiring DCCV 9/11 by Dr.  and continues on BB and amiodarone for rate control. Anxiety/ insomnia being managed with prn xanax. Fluid overload treated with diuresis. Pre op EF < 40%--blood pressures remain on low side--SBP< 100,  limiting ACE/ARB use.    Current Status:  During the clinical interview today which also included her significant other and one of her sons the patient clearly described significant and ongoing anger at her daughter that she had been visiting just before she left from [place] on an airplane. The patient made statements suggesting that she wishes that she had died from this event and relationship to frustrations with her daughter and wanting to get revenge against her daughter stating that she felt like her daughter was the reason why she had this happen. In any event there is clearly a lot of frustration and friction between the daughter and the patient. The patient's significant other and the patient both indicated that they felt that the amount of alcohol there were consuming on a regular basis was not a major issue and the patient felt that it was very therapeutic for her and caused her not to have panic attacks.  Behavioral Observation: Beth Austin  presents as a 71 y.o.-year-old Right Caucasian Female who appeared her stated age. her dress was Appropriate and she was Well Groomed and her manners were Appropriate, inappropriate, unnecessary at this time to the situation.  her participation was indicative of Intrusive and Monopolizing behaviors.  There were physical disabilities noted.   After about 30 minutes she became more receptive to full bidirectional discussions and  she began to display a more appropriate level of cooperation and motivation.     Interactions:    Active Monopolizing  Attention:   within normal limits and attention span appeared shorter than expected  for age  Memory:   within normal limits; recent and remote memory intact  Visuo-spatial:  not examined  Speech (Volume):  high  Speech:   normal; rapid  Thought Process:  Tangential  Though Content:  Obsessions; while the patient made vague and indirect insinuation that she felt like she should've died or be better off if she had died the patient denied that she had any intention or plan to initiate any self harming behavior. She did describe great anger towards her daughter and a desire for indirect revenge but also never indicated any impulses or desire to directly harm her daughter.  Orientation:   person, place, time/date and situation  Judgment:   Poor  Planning:   Poor  Affect:    Anxious, Defensive, Irritable and Labile  Mood:    Angry, Depressed and Irritable  Insight:   Shallow  Intelligence:   high  Marital Status/Living: The patient has been divorced for many years from her husband. She has 3 grown children. She has a very poor relationship with a least one of her daughters. The patient has been a long-term relationship with  another gentleman.  Current Employment: The patient works as a Warden/ranger courses in the travel industry.  Substance Use:  There is a documented history of alcohol abuse confirmed by the patient.  The patient reports that she drinks 1 bottle of wine each day. It is possible that there are frequent times where she consumes more than this.  Education:   Control and instrumentation engineer History:   Past Medical History:  Diagnosis Date  . Alcohol abuse   . Dvt femoral (deep venous thrombosis) (HCC)    after tibial fracture  . Dysrhythmia   . Generalized anxiety disorder with panic attacks   . GERD (gastroesophageal reflux disease)   . Hypertension   . PAF (paroxysmal atrial fibrillation) (HCC)    resolved with carotid massage in the past  . Right tibial fracture 1997   treated with cast        Psychiatric History:  The patient reports  that she has a history of panic attacks going back many years but reports that the panic attacks were held in check by her medicinal use of alcohol every day. There also suggestions that she may have had seizures in the past but it is unclear whether these seizures were due to epilepsy type condition or alcohol withdrawal symptoms.  Family Med/Psych History:  Family History  Problem Relation Age of Onset  . AAA (abdominal aortic aneurysm) Mother 41       diet of rupture  . Lung cancer Father 22  . Colon cancer Sister   . Stroke Maternal Grandmother     Risk of Suicide/Violence: moderate the patient denies any current suicidal or homicidal ideation. However, she does frequently make statements about how she wishes she had died due to the negative impact it would have on her daughter.  Impression/DX:  CHRISTINEA BRIZUELA is a 71 year old female who was initially admitted to [facility] on 02/26/2017 following seizure, pain radiating from chest and back and right lower extremity due to aortic dissection. The patient had been on a flight from [place] to [place]. The patient is reported to of started seizing on the plain and the plan had to be diverted to RDU. The patient was treated initially in the emergency department and then received vascular surgery for the aortic dissection. The patient did continue to have seizure primarily on right sided motor activity on 02/28/2017. She was placed on Keppra. The patient was later transported for comprehensive rehabilitation program at [facility] health. There are reports of family members that the patient had some type of seizures as much is 10 years ago and they were described today to be similar to absence seizures and/or temporal lobe seizures. 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   The patient had been returning from [place] after what she described as a tumultuous situation  with her daughter. Patient reports that she was very upset after leaving [place] and developed panic attacks on the airplane. The patient continues to verbally blame her daughter for the medical event she experienced. However, the patient does acknowledge that she has had panic attacks in the past. She reports that she stopped having them she began drinking a bottle of wine every night. The patient denies that she had a substance abuse problem and reports that she drank this much alcohol every day is both medication for her panic attacks which have been stopped by this alcohol consumption as well as "  just something people do in [place]".  The patient was very upset today when both myself as well as one of her treating physicians told her that she should not return to consuming alcohol and that it could be very detrimental to the effectiveness of her Keppra as well as increase her seizure risk and enough itself.    During the clinical interview today which also included her significant other and one of her sons the patient clearly described significant and ongoing anger at her daughter that she had been visiting just before she left from [place] on an airplane. The patient made statements suggesting that she wishes that she had died from this event and relationship to frustrations with her daughter and wanting to get revenge against her daughter stating that she felt like her daughter was the reason why she had this happen. In any event there is clearly a lot of frustration and friction between the daughter and the patient. The patient's significant other and the patient both indicated that they felt that the amount of alcohol there were consuming on a regular basis was not a major issue and the patient felt that it was very therapeutic for her and caused her not to have panic attacks.   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

## 2017-03-14 ENCOUNTER — Inpatient Hospital Stay (HOSPITAL_COMMUNITY): Payer: Managed Care, Other (non HMO) | Admitting: Physical Therapy

## 2017-03-14 ENCOUNTER — Inpatient Hospital Stay (HOSPITAL_COMMUNITY): Payer: Managed Care, Other (non HMO) | Admitting: Speech Pathology

## 2017-03-14 ENCOUNTER — Inpatient Hospital Stay (HOSPITAL_COMMUNITY): Payer: Managed Care, Other (non HMO) | Admitting: Occupational Therapy

## 2017-03-14 MED ORDER — MIRTAZAPINE 15 MG PO TBDP
7.5000 mg | ORAL_TABLET | Freq: Every day | ORAL | Status: DC
  Administered 2017-03-14: 7.5 mg via ORAL
  Filled 2017-03-14: qty 0.5

## 2017-03-14 MED ORDER — ALPRAZOLAM 0.25 MG PO TABS
0.1250 mg | ORAL_TABLET | Freq: Three times a day (TID) | ORAL | Status: DC | PRN
  Administered 2017-03-15 – 2017-03-19 (×6): 0.125 mg via ORAL
  Filled 2017-03-14 (×7): qty 1

## 2017-03-14 NOTE — Progress Notes (Signed)
Speech Language Pathology Session Note & Discharge Summary  Patient Details  Name: Beth Austin MRN: [id number] Date of Birth: 12-16-1945  Today's Date: 03/14/2017 SLP Individual Time: 1345-1405 SLP Individual Time Calculation (min): 20 min   Skilled Therapeutic Interventions:   Skilled treatment session focused on completion of patient and family education. Patient was Mod I for recall of her current medications and medication changes today to minimize anxiety and maximize sleep. SLP also facilitated sessio by educating the patient and her husband  in regards to her current cognitive function. Both report patient is at her cognitive baseline and was agreeable with patient being discharged from skilled SLP intervention with recommendations for f/u. Patient left upright in recliner with all needs within reach.   Patient has met 3 of 3 long term goals.  Patient to discharge at overall Modified Independent level.   Reasons goals not met: N/A   Clinical Impression/Discharge Summary: Patient has made functional gains and has met all LTG's. Currently, patient is overall Mod I to complete mildly complex and functional tasks safely in regards to recall, problem solving and awareness. However, patient's function appears to be impacted by lethargy suspect due to medications and anxiety. Patient and her significant other report patient is at her baseline level of cognitive functioning, therefore, patient will be discharged from skilled SLP intervention without recommendations for f/u.    Recommendation:  None      Equipment: N/A   Reasons for discharge: Treatment goals met   Patient/Family Agrees with Progress Made and Goals Achieved: Yes   Function:   Cognition Comprehension Comprehension assist level: Follows basic conversation/direction with extra time/assistive device  Expression   Expression assist level: Expresses basic 90% of the time/requires cueing < 10% of the time.  Social  Interaction Social Interaction assist level: Interacts appropriately with others with medication or extra time (anti-anxiety, antidepressant).  Problem Solving Problem solving assist level: Solves complex problems: With extra time  Memory Memory assist level: More than reasonable amount of time   [name] 03/14/2017, 3:18 PM

## 2017-03-14 NOTE — Progress Notes (Signed)
Occupational Therapy Session Note  Patient Details  Name: Beth Austin MRN: [id number] Date of Birth: [birth year]  Today's Date: 03/14/2017 OT Individual Time: 1420-1520 OT Individual Time Calculation (min): 60 min   Short Term Goals: Week 1:   STG=LTG 2/ 2ELOS  Skilled Therapeutic Interventions/Progress Updates:    OT treatment session focused on standing balance/endurance, activity tolerance, and LB strengthening. Pt with very flat affect and reports feeling very "out of it" today from sleep medication she got last night. Pt ambulated room distance to wc and reported she did not feel she had the energy to self-propel wc. Pt brought to therapy gym and worked on standing balance and LB coordination with number stepping activity in standing with RW support. LB strengthening with sit<>stand and stepping activity using clothes pins. Pt completed 20 sit<>stands total without RW, 3 seated rest breaks, and min verbal cues for stepping pattern. Addressed standing endurance and problem solving with standing peg board puzzle. Pt then ambulated back to room with RW and close supervision and 3 standing rest breaks. Pt left seated in recliner with heat applied to back, partner present, and needs met.   Therapy Documentation Precautions:  Precautions Precautions: Sternal, Fall Precaution Comments: able to recall functional things she cannot do but unable to say "no push/lift" Restrictions Weight Bearing Restrictions: Yes RUE Weight Bearing: Non weight bearing (sternal precautions) LUE Weight Bearing: Non weight bearing (sternal precautions) Other Position/Activity Restrictions: sternal precautions General: General OT Amount of Missed Time: 15 Minutes Pain: Pain Assessment Pain Assessment: 0-10 Pain Score: 5  Pain Type: Chronic pain Pain Location: Back Pain Orientation: Lower Pain Descriptors / Indicators: Aching Pain Onset: On-going Pain Intervention(s): Repositioned;Heat  applied ADL: ADL ADL Comments: see functional navigator  See Function Navigator for Current Functional Status.   Therapy/Group: Individual Therapy  Valma Cava 03/14/2017, 3:27 PM

## 2017-03-14 NOTE — IPOC Note (Signed)
Overall Plan of Care [facility]) Patient Details Name: Beth Austin MRN: [id number] DOB: 04/12/1946  Admitting Diagnosis: <principal problem not specified>  Hospital Problems: Active Problems:   Stroke (cerebrum) (HCC)   Seizures (HCC)   ETOH abuse   Anxiety state   Atrial fibrillation (HCC)   Panic disorder     Functional Problem List: Nursing Endurance, Medication Management, Skin Integrity, Safety, Pain  PT Balance, Endurance, Safety  OT Balance, Safety, Cognition, Endurance  SLP Cognition  TR Endurance, Motor, Pain, Safety       Basic ADL's: OT Grooming, Bathing, Dressing, Toileting     Advanced  ADL's: OT       Transfers: PT Bed Mobility, Bed to Chair, Car, Occupational psychologist, Research scientist (life sciences): PT Stairs, Psychologist, prison and probation services, Ambulation     Additional Impairments: OT Fuctional Use of Upper Extremity  SLP Social Cognition   Problem Solving, Memory, Awareness  TR      Anticipated Outcomes Item Anticipated Outcome  Self Feeding n/a  Swallowing      Basic self-care  supervision  Toileting  mod I    Bathroom Transfers mod I   Bowel/Bladder  Mod I  Transfers  supervision  Locomotion  supervision ambulatory  Communication     Cognition  Mod I   Pain  < 3  Safety/Judgment  Mod I   Therapy Plan: PT Intensity: Minimum of 1-2 x/day ,45 to 90 minutes PT Frequency: 5 out of 7 days PT Duration Estimated Length of Stay: 10-12 days OT Intensity: Minimum of 1-2 x/day, 45 to 90 minutes OT Frequency: 5 out of 7 days OT Duration/Estimated Length of Stay: ~11-13 days SLP Intensity: Minumum of 1-2 x/day, 30 to 90 minutes SLP Frequency: 1 to 3 out of 7 days SLP Duration/Estimated Length of Stay: 11-13 days     Team Interventions: Nursing Interventions Patient/Family Education, Medication Management, Pain Management, Disease Management/Prevention, Skin Care/Wound Management, Discharge Planning  PT interventions Ambulation/gait training,  Community reintegration, DME/adaptive equipment instruction, Neuromuscular re-education, Psychosocial support, Stair training, UE/LE Strength taining/ROM, UE/LE Coordination activities, Therapeutic Activities, Warden/ranger, Discharge planning, Pain management, Cognitive remediation/compensation, Functional mobility training, Patient/family education, Therapeutic Exercise  OT Interventions Balance/vestibular training, Discharge planning, Functional electrical stimulation, Pain management, Therapeutic Activities, Self Care/advanced ADL retraining, UE/LE Coordination activities, Therapeutic Exercise, Visual/perceptual remediation/compensation, Skin care/wound managment, Patient/family education, Functional mobility training, Disease mangement/prevention, Cognitive remediation/compensation, Firefighter, Fish farm manager, Neuromuscular re-education, Psychosocial support, UE/LE Strength taining/ROM  SLP Interventions Cognitive remediation/compensation, Financial trader, Environmental controls, Functional tasks, Patient/family education, Internal/external aids  TR Interventions Adaptive equipment instruction, 1:1 session, Warden/ranger, Functional mobility training, Firefighter, Equities trader education, Therapeutic activities, Therapeutic exercise  SW/CM Interventions Discharge Planning, Psychosocial Support, Patient/Family Education   Barriers to Discharge MD  Medical stability and Lack of/limited family support  Nursing Other (comments) Lives in [place]  PT Decreased caregiver support, Other (comments) pt unsure of d/c plan at this point, indicates that she may not have someone with her 24/7  OT      SLP      SW       Team Discharge Planning: Destination: PT-Home ,OT- Home , SLP-Home Projected Follow-up: PT-Outpatient PT, OT-  Outpatient OT, SLP-Other (comment) (TBD) Projected Equipment Needs: PT-None recommended by PT, OT- To be  determined, SLP-None recommended by SLP Equipment Details: PT- , OT-  Patient/family involved in discharge planning: PT- Patient, Family member/caregiver,  OT-Patient, Family member/caregiver, SLP-Patient  MD ELOS: 10-12d Medical Rehab Prognosis:  Excellent  Assessment:   71 year old female who was emergently admitted to [facility] 02/26/2017 with seizure, pain radiating from chest to back and RLE due to aortic dissection. She was flight from [place] to [place], started seizing and plane has to be diverted to RDU.  She was intubated in ED, RLE noted to be cold and taken to OR emergently for repair of thoracoabdominal aortic dissection with Bental procedure and AVR by Dr. Nolon Rod. 2D echo with EF 40-45% with mild LVH, mild to moderately global LVD and mild MVR/TVR.  She continued to have seizure with right sided motor activity on 9/6 and neurology recommended increasing Keppra to 1000 mg bid. To remain on keppra lifelong due to family reports of episodes suggestive of epilepsy going back to 10 years. 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 She developed A fib/A flutter requiring DCCV 9/11 by Dr.  and continues on BB and amiodarone for rate control. Anxiety/ insomnia being managed with prn xanax. Fluid overload treated with diuresis   Now requiring 24/7 Rehab RN,MD, as well as CIR level PT, OT and SLP.  Treatment team will focus on ADLs and mobility with goals set at Mod I See Team Conference Notes for weekly updates to the plan of care

## 2017-03-14 NOTE — Progress Notes (Addendum)
Subjective/Complaints:  Discussed need for cardiology f/u in 1 mo and f/u with CV surgeon at [facility] in early October We discussed rec for no ETOH for several reasons Lowering seizure threshold- she is on lifelong Keppra Causing gastritis which in combination with Eliquis can cause a major GI bleed Increased stroke risk ( in excess of 4 oz wine for female)   ROS- negative for CP, SOB, Abd pain , N/V/D   Objective: Vital Signs: Blood pressure 127/65, pulse 77, temperature 98.2 F (36.8 C), temperature source Oral, resp. rate 18, height 5' 10"  (1.778 m), weight 76.2 kg (168 lb), SpO2 98 %. No results found. Results for orders placed or performed during the hospital encounter of 03/11/17 (from the past 72 hour(s))  Comprehensive metabolic panel     Status: Abnormal   Collection Time: 03/11/17 10:32 PM  Result Value Ref Range   Sodium 137 135 - 145 mmol/L   Potassium 4.2 3.5 - 5.1 mmol/L   Chloride 105 101 - 111 mmol/L   CO2 25 22 - 32 mmol/L   Glucose, Bld 102 (H) 65 - 99 mg/dL   BUN 11 6 - 20 mg/dL   Creatinine, Ser 0.72 0.44 - 1.00 mg/dL   Calcium 8.5 (L) 8.9 - 10.3 mg/dL   Total Protein 5.3 (L) 6.5 - 8.1 g/dL   Albumin 2.9 (L) 3.5 - 5.0 g/dL   AST 18 15 - 41 U/L   ALT 55 (H) 14 - 54 U/L   Alkaline Phosphatase 78 38 - 126 U/L   Total Bilirubin 0.6 0.3 - 1.2 mg/dL   GFR calc non Af Amer >60 >60 mL/min   GFR calc Af Amer >60 >60 mL/min    Comment: (NOTE) The eGFR has been calculated using the CKD EPI equation. This calculation has not been validated in all clinical situations. eGFR's persistently <60 mL/min signify possible Chronic Kidney Disease.    Anion gap 7 5 - 15  CBC WITH DIFFERENTIAL     Status: Abnormal   Collection Time: 03/12/17  6:56 AM  Result Value Ref Range   WBC 10.1 4.0 - 10.5 K/uL   RBC 2.77 (L) 3.87 - 5.11 MIL/uL   Hemoglobin 8.0 (L) 12.0 - 15.0 g/dL   HCT 25.8 (L) 36.0 - 46.0 %   MCV 93.1 78.0 - 100.0 fL   MCH 28.9 26.0 - 34.0 pg   MCHC 31.0 30.0 -  36.0 g/dL   RDW 14.0 11.5 - 15.5 %   Platelets 366 150 - 400 K/uL   Neutrophils Relative % 77 %   Neutro Abs 7.7 1.7 - 7.7 K/uL   Lymphocytes Relative 15 %   Lymphs Abs 1.5 0.7 - 4.0 K/uL   Monocytes Relative 8 %   Monocytes Absolute 0.8 0.1 - 1.0 K/uL   Eosinophils Relative 0 %   Eosinophils Absolute 0.0 0.0 - 0.7 K/uL   Basophils Relative 0 %   Basophils Absolute 0.0 0.0 - 0.1 K/uL  Comprehensive metabolic panel     Status: Abnormal   Collection Time: 03/12/17  6:56 AM  Result Value Ref Range   Sodium 137 135 - 145 mmol/L   Potassium 3.8 3.5 - 5.1 mmol/L   Chloride 105 101 - 111 mmol/L   CO2 25 22 - 32 mmol/L   Glucose, Bld 96 65 - 99 mg/dL   BUN 9 6 - 20 mg/dL   Creatinine, Ser 0.80 0.44 - 1.00 mg/dL   Calcium 8.6 (L) 8.9 - 10.3 mg/dL  Total Protein 5.7 (L) 6.5 - 8.1 g/dL   Albumin 3.0 (L) 3.5 - 5.0 g/dL   AST 20 15 - 41 U/L   ALT 54 14 - 54 U/L   Alkaline Phosphatase 89 38 - 126 U/L   Total Bilirubin 0.6 0.3 - 1.2 mg/dL   GFR calc non Af Amer >60 >60 mL/min   GFR calc Af Amer >60 >60 mL/min    Comment: (NOTE) The eGFR has been calculated using the CKD EPI equation. This calculation has not been validated in all clinical situations. eGFR's persistently <60 mL/min signify possible Chronic Kidney Disease.    Anion gap 7 5 - 15     HEENT: normal Cardio: RRR and no murmur Resp: CTA B/L and unlabored GI: BS positive and Non tender Extremity:  Pulses positive and No Edema Skin:   Intact Neuro: Alert/Oriented, Normal Sensory and Abnormal FMC Ataxic/ dec FMC 4+ BUE and BLE Musc/Skel:  Normal Gen NAD   Assessment/Plan: 1. 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 which require 3+ hours per day of interdisciplinary therapy in a comprehensive inpatient rehab setting. Physiatrist is providing close team supervision and 24 hour management of active medical problems listed below. Physiatrist and rehab team continue to assess barriers to discharge/monitor patient  progress toward functional and medical goals. FIM: Function - Bathing Position: Shower Body parts bathed by patient: Right arm, Left arm, Chest, Abdomen, Front perineal area, Buttocks, Right upper leg, Left upper leg, Right lower leg, Left lower leg Body parts bathed by helper: Back Assist Level: Set up, Supervision or verbal cues Set up : To obtain items, To adjust water temperature, To open containers  Function- Upper Body Dressing/Undressing What is the patient wearing?: Pull over shirt/dress Pull over shirt/dress - Perfomed by patient: Thread/unthread right sleeve, Thread/unthread left sleeve, Put head through opening Pull over shirt/dress - Perfomed by helper: Put head through opening, Pull shirt over trunk Assist Level: Supervision or verbal cues, Set up Set up : To obtain clothing/put away Function - Lower Body Dressing/Undressing What is the patient wearing?: Underwear, Pants, Shoes Position: Wheelchair/chair at sink Underwear - Performed by patient: Thread/unthread right underwear leg, Thread/unthread left underwear leg, Pull underwear up/down Underwear - Performed by helper: Thread/unthread right underwear leg Pants- Performed by patient: Thread/unthread right pants leg, Thread/unthread left pants leg, Pull pants up/down Pants- Performed by helper: Thread/unthread right pants leg Shoes - Performed by patient: Don/doff right shoe, Don/doff left shoe Assist for footwear: Supervision/touching assist Assist for lower body dressing: Supervision or verbal cues, Touching or steadying assistance (Pt > 75%)  Function - Toileting Toileting steps completed by patient: Adjust clothing prior to toileting, Performs perineal hygiene, Adjust clothing after toileting Toileting Assistive Devices: Grab bar or rail Assist level: Touching or steadying assistance (Pt.75%)  Function - Toilet Transfers Assist level to toilet: Touching or steadying assistance (Pt > 75%) Assist level from toilet:  Touching or steadying assistance (Pt > 75%)  Function - Chair/bed transfer Chair/bed transfer method: Ambulatory Chair/bed transfer assist level: Touching or steadying assistance (Pt > 75%) Chair/bed transfer assistive device:  (heart pillow) Chair/bed transfer details: Verbal cues for precautions/safety  Function - Locomotion: Wheelchair Will patient use wheelchair at discharge?: No Function - Locomotion: Ambulation Assistive device: No device Max distance: 40 Assist level: Touching or steadying assistance (Pt > 75%) Assist level: Touching or steadying assistance (Pt > 75%) Walk 50 feet with 2 turns activity did not occur: Safety/medical concerns Walk 150 feet activity did not occur: Safety/medical  concerns Walk 10 feet on uneven surfaces activity did not occur: Safety/medical concerns  Function - Comprehension Comprehension: Auditory Comprehension assist level: Follows basic conversation/direction with extra time/assistive device  Function - Expression Expression: Verbal Expression assist level: Expresses basic 90% of the time/requires cueing < 10% of the time.  Function - Social Interaction Social Interaction assist level: Interacts appropriately 90% of the time - Needs monitoring or encouragement for participation or interaction.  Function - Problem Solving Problem solving assist level: Solves basic 75 - 89% of the time/requires cueing 10 - 24% of the time  Function - Memory Memory assist level: Recognizes or recalls 75 - 89% of the time/requires cueing 10 - 24% of the time Patient normally able to recall (first 3 days only): Current season, Location of own room, Staff names and faces, That he or she is in a hospital  Medical Problem List and Plan: 1.  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 CIR PT, OT, SLP Team conference today please see physician documentation under team conference tab,  met with team face-to-face to discuss problems,progress, and goals. Formulized individual treatment plan based on medical history, underlying problem and comorbidities. 2.  DVT Prophylaxis/Anticoagulation: Pharmaceutical: Lovenox- cardiology started Eliquis may d/c lovenox 3. Pain Management:mainly opper back pain and incisional pain Will continue  ultram prn, warm Kpad for back 4. Mood: LCSW to follow for evaluation and support. 5. Neuropsych: This patient is not fully capable of making decisions on her own behalf. 6. Skin/Wound Care: Monitor wound daily for healing. Maintain adequate hydration and nutritional status 7. Fluids/Electrolytes/Nutrition: Monitor I/O. Check lytes in am. 8. A fib s/p DCCV: Monitor HR bid. Continue amiodarone. Metoprolol resumed today--monitor for orthostatic symptoms.  Reassured pt that she can do therapy today Cardiology on consult, had episode of RVR 9/17- f/u in one month from now, ~3wks post d/c  9. Anxiety disorder: Continue Xanax prn.add remeron qhs which helped with sleep last noc 10. Seizure activity: On keppra bid for lifelong basis per neurology. 11. H/o Alcohol abuse: Has competed CIWA protocol.Pt states she drank in evenings to relax, "didn't realize how much I depended on it"  12.  Hypoalb start prostat- discussed rec to increase protein intake 13.  ABLA monitor Hgb  Extended visit to discuss f/u and risks of ETOH as noted , SO also asked questions LOS (Days) 3 A FACE TO FACE EVALUATION WAS PERFORMED  Yuvin Bussiere E 03/14/2017, 8:01 AM

## 2017-03-14 NOTE — Progress Notes (Signed)
Physical Therapy Session Note  Patient Details  Name: Beth Austin MRN: [id number] Date of Birth: 08/23/1945  Today's Date: 03/14/2017 PT Individual Time: 0900-1023 PT Individual Time Calculation (min): 83 min   Short Term Goals: Week 1:  PT Short Term Goal 1 (Week 1): Pt will complete DGI balance assessment PT Short Term Goal 2 (Week 1): Pt will negotiate 12 steps with min assist PT Short Term Goal 3 (Week 1): Pt will recall 3/3 sternal precautions without cues PT Short Term Goal 4 (Week 1): Pt will ambulate 100' with min assist   Skilled Therapeutic Interventions/Progress Updates:    no c/o pain at rest, but later c/o 8/10 pain in upper/L back.  Session focus on balance and activity tolerance with functional mobility.   Pt transfers throughout session with supervision and verbal cues for not using UEs to sit.    Therex: x10-15 reps standing marching and heel/toe raises with BUE support, verbal cues to minimize weight bearing through UEs, rest breaks between each exercise 2/2 light headedness.  BP 106/46 RN aware, applied TEDs and encouraged fluids.   Seated balance during bimanual cup stacking task supervision for balance.  Pt progresses from seated on firm surface>seated on dyandisk>dynadisk reaching to low table.    Gait training for mobility and activity tolerance x40'+50'+50' with no AD and min assist.  Pt requires seated rest breaks between each trial and min verbal cues during ambulation for upright posture.    Pt returned to bed at end of session with supervision for sit>supine and +2 with draw sheet to scoot to HOB 2/2 pain.  Pt positioned to comfort with call bell in reach and needs met.   Therapy Documentation Precautions:  Precautions Precautions: Sternal, Fall Precaution Comments: able to recall functional things she cannot do but unable to say "no push/lift" Restrictions Weight Bearing Restrictions: Yes RUE Weight Bearing: Non weight bearing (sternal  precautions) LUE Weight Bearing: Non weight bearing (sternal precautions) Other Position/Activity Restrictions: sternal precautions   See Function Navigator for Current Functional Status.   Therapy/Group: Individual Therapy  Caitlin E Warren 03/14/2017, 10:00 AM  

## 2017-03-15 ENCOUNTER — Inpatient Hospital Stay (HOSPITAL_COMMUNITY): Payer: Managed Care, Other (non HMO) | Admitting: Physical Therapy

## 2017-03-15 ENCOUNTER — Inpatient Hospital Stay (HOSPITAL_COMMUNITY): Payer: Managed Care, Other (non HMO) | Admitting: Occupational Therapy

## 2017-03-15 ENCOUNTER — Telehealth: Payer: Self-pay | Admitting: Internal Medicine

## 2017-03-15 DIAGNOSIS — I69398 Other sequelae of cerebral infarction: Secondary | ICD-10-CM

## 2017-03-15 DIAGNOSIS — R269 Unspecified abnormalities of gait and mobility: Secondary | ICD-10-CM

## 2017-03-15 MED ORDER — ESCITALOPRAM OXALATE 10 MG PO TABS
5.0000 mg | ORAL_TABLET | Freq: Every day | ORAL | Status: DC
  Administered 2017-03-15 – 2017-03-20 (×6): 5 mg via ORAL
  Filled 2017-03-15 (×6): qty 1

## 2017-03-15 NOTE — Progress Notes (Signed)
Occupational Therapy Session Note  Patient Details  Name: Beth Austin MRN: [id number] Date of Birth: 1946/02/12  Today's Date: 03/15/2017  Session 1 OT Individual Time: 0800-0900 OT Individual Time Calculation (min): 60 min   Session 2 OT Individual Time: 1417-1530 OT Individual Time Calculation (min): 73 min   Short Term Goals: Week 1:   STG=LTG 2/2 ELOS  Skilled Therapeutic Interventions/Progress Updates:  Session 1   OT treatment session focused on activity tolerance, sternal precautions, and modified bathing/dressing. Pt ambulated to bathroom with significant others contact gaurd assist. He demonstrated good understanding of safety precautions with pt. Pt voided bladder and was able to manage 3/3 toileting steps. Bathing completed shower level with verbal cues for safe technique and to maintain sternal precautions with stand. Pt also needed assist with opening and obtaining containers 2/2 decreased hand strength. Dressing completed with demonstration for modified strategy and min A to thread R LE into pants. Addressed standing balance/endurance with standing grooming task with extended rest breaks. Pt tolerated 2 mins standing to brush teeth, then 4 mins to blow dry hair. Pt able to utilise B UE's for 2 mins while drying hair but needed assistance to dry the back of hair. Discussed pt's goals for OT and left pt seated in wc with significant other present and needs met.   Session 2 Pt more fatigued this afternoon after recently finishing PT session but aggreable to participate. Pt ambulated to nurses station without AD and min steadying assist. Addressed R fine motor control and hand strengthening with medium soft red thera-putty, foam, and writing exercises. Pt with difficulty conceptualizing written directions for theraputty exercises requiring demonstration to complete successfully. Addressed translation, rotation, thumb opposition, and finger/grip strengthening. Pt then worked on hand  witting and grip on pencil with difficulty maintaining straight line and decrease smoothness of letters. Provided patient with larger highliter and worked on increased smoothness and visual perceptual skills with tracing task. Provided pt with fine motor home program handouts and discussed continued practice with R hand. Pt reports most of her work is done on the computer and she is concerned about typing. Pt brought to family room and worked on Armed forces logistics/support/administrative officer game. Pt needed increased time and cues for hand placement on key board. Pt pushed back to room in wc at end of session 2/2 fatigue. Educated pt's son on accessibility functions on her iphone and he was able to adjust text size. Also provided pt with dysom to help her hold her phone when typing on it. Pt returned to bed and left semi-reclined with needs met.   Therapy Documentation Precautions:  Precautions Precautions: Sternal, Fall Precaution Comments: able to recall functional things she cannot do but unable to say "no push/lift" Restrictions Weight Bearing Restrictions: Yes RUE Weight Bearing: Non weight bearing (sternal precautions) LUE Weight Bearing: Non weight bearing (sternal precautions) Other Position/Activity Restrictions: sternal precautions  See Function Navigator for Current Functional Status.  Therapy/Group: Individual Therapy  Valma Cava 03/15/2017, 3:47 PM

## 2017-03-15 NOTE — Telephone Encounter (Signed)
Closed Encounter  °

## 2017-03-15 NOTE — Progress Notes (Signed)
Physical Therapy Session Note  Patient Details  Name: Beth Austin MRN: [id number] Date of Birth: 15-Jun-1946  Today's Date: 03/15/2017 PT Individual Time: 6659-9357 PT Individual Time Calculation (min): 57 min   Short Term Goals: Week 1:  PT Short Term Goal 1 (Week 1): Pt will complete DGI balance assessment PT Short Term Goal 2 (Week 1): Pt will negotiate 12 steps with min assist PT Short Term Goal 3 (Week 1): Pt will recall 3/3 sternal precautions without cues PT Short Term Goal 4 (Week 1): Pt will ambulate 100' with min assist   Skilled Therapeutic Interventions/Progress Updates:    no c/o pain, reports feeling much better today.  Session focus on strengthening, balance, and activity tolerance via therex and functional mobility.    Pt transfers throughout session with supervision, min verbal cues for abiding by sternal precautions.  Gait 2x200' with supervision, occasional min guard with LOB, rest break in between trials.  Pt with increased gait speed today and improved upright posture compared to yesterday.  Nustep x10 minutes at level 4 for activity tolerance.  PT instructs pt in minisquats, side stepping L/R, forward/retro stepping, and tandem stance (x1 minute each foot leading) focus on strengthening and balance.  Pt returned to room at end of session and positioned upright in recliner with call bell in reach and needs met.   Therapy Documentation Precautions:  Precautions Precautions: Sternal, Fall Precaution Comments: able to recall functional things she cannot do but unable to say "no push/lift" Restrictions Weight Bearing Restrictions: Yes RUE Weight Bearing: Non weight bearing (sternal precautions) LUE Weight Bearing: Non weight bearing (sternal precautions) Other Position/Activity Restrictions: sternal precautions   See Function Navigator for Current Functional Status.   Therapy/Group: Individual Therapy  Michel Santee 03/15/2017, 1:59 PM

## 2017-03-15 NOTE — Progress Notes (Signed)
Social Work Patient ID: Beth Austin, female   DOB: Nov 17, 1945, 71 y.o.   MRN: [id number]   CSW met with pt, pt's son, and pt's partner 03-14-17 to update them on team conference discussion and targeted d/c date of 03-21-17.  Family is working to make plans for where pt will go after d/c until she follows up with cardiology at [facility] and other [facility] follow ups.  CSW will assist with support services and f/u services.  Pt was appreciative of neuropsychologist visit and CSW will ask him to f/u with pt again prior to d/c.  CSW will continue to follow.

## 2017-03-15 NOTE — Progress Notes (Signed)
Today during the previous shift, patients Remeron & xanax was decreased due to drowsiness. Patient had just started the Remeron the night previously. The patient asked the tech to tell the nurse that she needed her xanax. When the nurse presented to give her night meds with the xanax, the patients significant other stated that she wanted to hold off on the xanax until later. Then the patient stated that if she took it at that time, she would wake up during the night & not be able to get another. So she wanted to take it later in the night. So at approximately 2000, xanax was not given. After giving the patient her meds, she asked if the nurse would be back in an hour to give her the xanax. Nurse informed patient that it would be more like 2 hours due to the night med pass & other patients unless urgently needed. Patient showed no signs of anxiety or distress. BP & pulse were WNL, no diaphoresis or SOB noted. The nurse did go back to the patients room at approximately 2200 & patient was asleep. Called patient by name & she answered & still wanted the xanax but was very drowsy. While scanning the medication, she drifted off again, but was aroused by name & took the medicine. Patient slept through the night, but it was reported that she was very drowsy during therapy sessions. Her Remeron & xanax were decreased to half the dose. This evening, the nurse went to give night time meds & patient stated that she could hardly do anything due to the medication making her too drowsy. She & her significant other asked about the remeron & the xanax. They both decided that the patient would not take the xanax, which was always PRN and asked for by the patient. She started to takje the medication, but then stopped to ask if she had been given anything for pain. The nurse had already listed her medications & given indications. The patient was informed that she stated that she wasn't in any pain & the meds that she was about to take  were not for pain relief. She then took the meds & asked "if I need pain medication later, can I get it". The nurse replied yes. This happened at approximately 2200. At approximately 0030, patient called asking for the xanax. The nurse pulled the xanax for the 2nd time & went into the patients room. Patient stated that she had not slept since the nurse was in the room last time. The significant other was sleeping on the pull out bed in the room. Patient wanted to sit up, but had to be helped & reminded to splint her chest. After sitting up, she feebly took the medicine cup & put the medication in her mouth, then she weakly lifted the cup, the cup had to be supported & her back while this happened. She was leaning to her right. Then she wanted to lay back down & have the nurses pull her up. Attempted to get the patient to stand & move up in the bed to prevent pulling on her, but she did not follow the direction & the pad moved to where we could not pull her up. Patient was instructed again to sit up, stand up & side step toward the head of the bed. She was assisted to sit up while splinting her chest & the maneuver was successful. Patient was left comfortable. Will continue to monitor.

## 2017-03-15 NOTE — Patient Care Conference (Signed)
Inpatient RehabilitationTeam Conference and Plan of Care Update Date: 03/13/2017   Time: 11:25 AM    Patient Name: Beth Austin      Medical Record Number: [id number]  Date of Birth: 09-12-1945 Sex: Female         Room/Bed: 4M06C/4M06C-01 Payor Info: Payor: CIG[employer] / Plan: CIGNA MANAGED / Product Type: *No Product type* /    Admitting Diagnosis: CVA  Admit Date/Time:  03/11/2017  4:18 PM Admission Comments: No comment available   Primary Diagnosis:  <principal problem not specified> Principal Problem: <principal problem not specified>  Patient Active Problem List   Diagnosis Date Noted  . Panic disorder   . 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 Seizures (HCC)   . ETOH abuse   . Anxiety state   . Atrial fibrillation [facility])     Expected Discharge Date: Expected Discharge Date: 03/21/17  Team Members Present: Physician leading conference: Dr. Claudette Laws Social Worker Present: Staci Acosta, LCSW Nurse Present: Carlean Purl, RN PT Present: Teodoro Kil, PT OT Present: Kearney Hard, OT SLP Present: Feliberto Gottron, SLP PPS Coordinator present : Tora Duck, RN, CRRN     Current Status/Progress Goal Weekly Team Focus  Medical   Anemia, fatigue with therapy, hx of anxiety, excess ETOH use  maintain med stability during rehab stay  determine anticoagulation needs, surgical f/u is early Octber in [place]   Bowel/Bladder   Continent of bowel & bladder, LBM 03/12/17  remain continent  continue to monitor & assist as needed   Swallow/Nutrition/ Hydration             ADL's   min  A overall with short distance functional ambulation without AD with min A, decr activity tolerance  mod I for transfers, supervision for functional tasks  cognitive remediation, functional mobility , fam/ pt education   Mobility   min assist overall, gets dizzy with increased activity ?anxiety vs deconditioning  supervision overall  d/c planning, activity tolerance, balance, gait without  device   Communication             Safety/Cognition/ Behavioral Observations  min assist, mild higher level cognitive deficits, question medication effects versus primary stroke deficits   Mod I   memory, executive functinoing, error awareness    Pain   no c/o pain on nights/ has tramadol prn  pain scale <4  continue to assess & treat as needed   Skin   steri strips to sterna incision & incision to the right upper chest, no signs of skin break down  no new areas of skin break down  assess q shift    Rehab Goals Patient on target to meet rehab goals: Yes Rehab Goals Revised: none *See Care Plan and progress notes for long and short-term goals.     Barriers to Discharge  Current Status/Progress Possible Resolutions Date Resolved   Physician    Inaccessible home environment;Medical stability  determine anticoag, monitor hgb  progressing toward goals  Cardiology f/u      Nursing  Other (comments)  Lives in [place]            PT  Decreased caregiver support;Other (comments)  pt unsure of d/c plan at this point, indicates that she may not have someone with her 24/7              OT                  SLP  SW                Discharge Planning/Teaching Needs:  Pt plans to find an extended stay type hotel to stay in with her partner, Brett Canales, until she can go back to [place] and see cardiologist to be cleared to leave the area.  Family is working on this.  Brett Canales is here daily and will be open to family education as needed.  Pt's dtr and son are also taking turns being here with pt.   Team Discussion:  Pt has been traveling a lot, building a house in [place], and has signficant stress/anxiety.  Pt with difficulty sleeping and Dr. Wynn Banker is changing medications for more restful sleep.  Pt with CVA deficits on right side.  ST evaluated pt for higher level cognitive deficits.  Pt is min A for gait and had some dizziness once up.  MD asked therapists to check orthostats.  Pt is min A with OT  and has mod I to supervision goals overall.  Revisions to Treatment Plan:  none    Continued Need for Acute Rehabilitation Level of Care: The patient requires daily medical management by a physician with specialized training in physical medicine and rehabilitation for the following conditions: Daily direction of a multidisciplinary physical rehabilitation program to ensure safe treatment while eliciting the highest outcome that is of practical value to the patient.: Yes Daily medical management of patient stability for increased activity during participation in an intensive rehabilitation regime.: Yes Daily analysis of laboratory values and/or radiology reports with any subsequent need for medication adjustment of medical intervention for : Post surgical problems;Neurological problems;Blood pressure problems  Dawnya Grams, [place] Deck 03/15/2017, 1:50 PM

## 2017-03-15 NOTE — Progress Notes (Addendum)
Subjective/Complaints:  Knocked out all day yesterday due to remeron, asking about another SSRI Questions about d/c  ROS- negative for CP, SOB, Abd pain , N/V/D   Objective: Vital Signs: Blood pressure (!) 124/58, pulse 79, temperature 98.6 F (37 C), temperature source Oral, resp. rate (!) 22, height  (1.778 m), weight 77.8 kg (171 lb 8 oz), SpO2 96 %. No results found. No results found for this or any previous visit (from the past 72 hour(s)).   HEENT: normal Cardio: RRR and no murmur Resp: CTA B/L and unlabored GI: BS positive and Non tender Extremity:  Pulses positive and No Edema Skin:   Intact, sternotomy healing well Neuro: Alert/Oriented, Normal Sensory and Abnormal FMC Ataxic/ dec FMC 4+ BUE and BLE Musc/Skel:  Normal Gen NAD   Assessment/Plan: 1. 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 which require 3+ hours per day of interdisciplinary therapy in a comprehensive inpatient rehab setting. Physiatrist is providing close team supervision and 24 hour management of active medical problems listed below. Physiatrist and rehab team continue to assess barriers to discharge/monitor patient progress toward functional and medical goals. FIM: Function - Bathing Position: Shower Body parts bathed by patient: Right arm, Left arm, Chest, Abdomen, Front perineal area, Buttocks, Right upper leg, Left upper leg, Right lower leg, Left lower leg Body parts bathed by helper: Back Assist Level: Set up, Supervision or verbal cues Set up : To obtain items, To adjust water temperature, To open containers  Function- Upper Body Dressing/Undressing What is the patient wearing?: Pull over shirt/dress Pull over shirt/dress - Perfomed by patient: Thread/unthread right sleeve, Thread/unthread left sleeve, Put head through opening Pull over shirt/dress - Perfomed by helper: Put head through opening, Pull shirt over trunk Assist Level: Supervision or verbal cues, Set up Set up : To  obtain clothing/put away Function - Lower Body Dressing/Undressing What is the patient wearing?: Underwear, Pants, Shoes Position: Wheelchair/chair at sink Underwear - Performed by patient: Thread/unthread right underwear leg, Thread/unthread left underwear leg, Pull underwear up/down Underwear - Performed by helper: Thread/unthread right underwear leg Pants- Performed by patient: Thread/unthread right pants leg, Thread/unthread left pants leg, Pull pants up/down Pants- Performed by helper: Thread/unthread right pants leg Shoes - Performed by patient: Don/doff right shoe, Don/doff left shoe Assist for footwear: Supervision/touching assist Assist for lower body dressing: Supervision or verbal cues, Touching or steadying assistance (Pt > 75%)  Function - Toileting Toileting activity did not occur: Safety/medical concerns Toileting steps completed by patient: Adjust clothing prior to toileting, Adjust clothing after toileting Toileting Assistive Devices: Grab bar or rail Assist level: Touching or steadying assistance (Pt.75%)  Function - Archivist transfer assistive device: Bedside commode Assist level to toilet: Touching or steadying assistance (Pt > 75%) Assist level from toilet: Touching or steadying assistance (Pt > 75%) Assist level to bedside commode (at bedside): Touching or steadying assistance (Pt > 75%) (per Jobelle Mesias, NT) Assist level from bedside commode (at bedside): Touching or steadying assistance (Pt > 75%)  Function - Chair/bed transfer Chair/bed transfer method: Ambulatory Chair/bed transfer assist level: Touching or steadying assistance (Pt > 75%) Chair/bed transfer assistive device:  (heart pillow) Chair/bed transfer details: Verbal cues for precautions/safety  Function - Locomotion: Wheelchair Will patient use wheelchair at discharge?: No Function - Locomotion: Ambulation Assistive device: No device Max distance: 40 Assist level: Touching or  steadying assistance (Pt > 75%) Assist level: Touching or steadying assistance (Pt > 75%) Walk 50 feet with 2 turns activity did  not occur: Safety/medical concerns Walk 150 feet activity did not occur: Safety/medical concerns Walk 10 feet on uneven surfaces activity did not occur: Safety/medical concerns  Function - Comprehension Comprehension: Auditory Comprehension assist level: Follows basic conversation/direction with extra time/assistive device  Function - Expression Expression: Verbal Expression assist level: Expresses basic 90% of the time/requires cueing < 10% of the time.  Function - Social Interaction Social Interaction assist level: Interacts appropriately with others with medication or extra time (anti-anxiety, antidepressant).  Function - Problem Solving Problem solving assist level: Solves complex problems: With extra time  Function - Memory Memory assist level: More than reasonable amount of time Patient normally able to recall (first 3 days only): Current season, Location of own room, Staff names and faces, That he or she is in a hospital  Medical Problem List and Plan: 1.  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 CIR PT, OT, SLP. 2.  DVT Prophylaxis/Anticoagulation: Pharmaceutical: Lovenox- cardiology started Eliquis 3. Pain Management:mainly opper back pain and incisional pain Will continue  ultram prn, warm Kpad for back 4. Mood: LCSW to follow for evaluation and support. 5. Neuropsych: This patient is not fully capable of making decisions on her own behalf. 6. Skin/Wound Care: Monitor wound daily for healing. Maintain adequate hydration and nutritional status 7. Fluids/Electrolytes/Nutrition: Monitor I/O. Check lytes in am. 8. A fib s/p DCCV: Monitor HR bid. Continue amiodarone. Metoprolol resumed today--monitor for orthostatic symptoms.  Reassured pt that she can do therapy  today Cardiology on consult, had episode of RVR 9/17- f/u in one month from now, ~3wks post d/c  9. Anxiety disorder: Continue Xanax prn. remeron changed to lexapro 10. Seizure activity: On keppra bid for lifelong basis per neurology. 11. H/o Alcohol abuse: Has competed CIWA protocol.Pt states she drank in evenings to relax, "didn't realize how much I depended on it"  12.  Hypoalb start prostat- discussed rec to increase protein intake 13.  ABLA monitor Hgb 14.  S/p Bentall procedure, bioprosthetic valve  LOS (Days) 4 A FACE TO FACE EVALUATION WAS PERFORMED  Kellyjo Edgren E 03/15/2017, 7:43 AM

## 2017-03-16 ENCOUNTER — Encounter (HOSPITAL_COMMUNITY): Payer: Self-pay | Admitting: *Deleted

## 2017-03-16 ENCOUNTER — Inpatient Hospital Stay (HOSPITAL_COMMUNITY): Payer: Managed Care, Other (non HMO) | Admitting: Occupational Therapy

## 2017-03-16 MED ORDER — WHITE PETROLATUM GEL
Status: AC
  Administered 2017-03-16: 14:00:00
  Filled 2017-03-16: qty 1

## 2017-03-16 NOTE — Progress Notes (Signed)
Social Work Assessment and Plan  Patient Details  Name: Beth Austin MRN: [id number] Date of Birth: [birth year]  Today's Date: 03/14/2017  Problem List:  Patient Active Problem List   Diagnosis Date Noted  . Panic disorder   . Stroke (cerebrum) ([place]) 03/11/2017  . Seizures ([place])   . ETOH abuse   . Anxiety state   . Atrial fibrillation [facility])    Past Medical History:  Past Medical History:  Diagnosis Date  . Alcohol abuse   . Dvt femoral (deep venous thrombosis) (HCC)    after tibial fracture  . Dysrhythmia   . Generalized anxiety disorder with panic attacks   . GERD (gastroesophageal reflux disease)   . Hypertension   . PAF (paroxysmal atrial fibrillation) (HCC)    resolved with carotid massage in the past  . Right tibial fracture 1997   treated with cast   Past Surgical History:  Past Surgical History:  Procedure Laterality Date  . ASCENDING AORTIC ANEURYSM REPAIR W/ MECHANICAL AORTIC VALVE REPLACEMENT  02/26/2017   Social History:  reports that she has never smoked. She has never used smokeless tobacco. Her alcohol and drug histories are not on file.  Family / Support Systems Marital Status: Single (but with partner, [name], for 29 years) Patient Roles: Parent, Other (Comment), Partner (sister; professor; friend) Spouse/Significant Other: Alonna Minium - partner - [phone] Children: Tamala Manzer - son - [phone]; Fauna Neuner - dtr - [phone] Other Supports: Illa Level - sister - [phone] Anticipated Caregiver: Daughter and partner Ability/Limitations of Caregiver: none Caregiver Availability: 24/7 Family Dynamics: Close with [name], dtr, and son.  Having some challenges with another dtr presently.  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  She will likely retire after spring semester, per her thoughts now. Legal History/Current Legal Issues: none reported Guardian/Conservator: Per MD, pt is not yet fully capable to make her own decisions.  Pt's children are next of kin and are assisting.   Abuse/Neglect Physical Abuse: Denies Verbal Abuse: Denies Sexual Abuse: Denies Exploitation of patient/patient's resources: Denies Self-Neglect: Denies  Emotional Status Pt's affect, behavior and adjustment status: Pt was very tearful and emotional with CSW, especially in talking about one of her dtrs and her work and not being able to return and possibly needing to retire sooner than she had hoped. Recent Psychosocial Issues: Pt is having a challenging time with one of her dtrs and this is a great source of stress and sadness for pt. Psychiatric History: Pt with hx of anxiety. Substance Abuse History: Pt also with a hx of substance abuse - alcohol.  Patient / Family Perceptions, Expectations & Goals Pt/Family understanding of illness & functional limitations: Pt/son/partner report a good understanding of pt's medical condition. Premorbid pt/family roles/activities: Pt was teaching several classes, going to conferences, doing research, building a home in [place], [place], etc. Anticipated changes in roles/activities/participation: Pt would like to resume above activities as she is able, but recognizes maybe this all happened to her to help her to slow down and not do so much. 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  She also wants to regain her independence.  Community Resources Express Scripts: None Premorbid Home Care/DME Agencies: None Transportation available at  discharge: family Resource referrals recommended: Neuropsychology, Support group (specify)  Discharge Planning Living Arrangements: Alone Support Systems:  Spouse/significant other, Children, Other relatives, Friends/neighbors Type of Residence: Private residence Insurance Resources: Multimedia programmer (specify) Psychologist, counselling) Financial Resources: Employment Financial Screen Referred: No Money Management: Patient Does the patient have any problems obtaining your medications?: No Home Management: Pt was doing this. Patient/Family Preliminary Plans: Pt plans to stay in [place] until she has seen all of her doctors in [place] and is cleared to go to [place].  At that point, pt will go to dtr's home until she can move into her new home in [place] alone. Social Work Anticipated Follow Up Needs: HH/OP Expected length of stay: 10 to 13 days  Clinical Impression CSW met with pt, her son, and her partner, [name] to introduce self and role of CSW, as well as to complete assessment.  Pt was very emotional during CSW's visit, but she was able to regain control of her emotions and finish our visit.  Pt's son asked about support groups for alcohol abstinence and stroke support.  Pt was agreeable and CSW will give them these resources.  Pt/family have made preliminary plans for pt and [name] to go stay in an extended stay hotel with children taking turns to come into town to see pt.  Pt feels she will have more privacy to do this, than to stay with her sister, although her sister will be available for support as needed.  Pt will then f/u with local and [place] doctors and once f/u is complete and pt has the okay to travel, she will go to her dtr's home in [place], as stated above.  Pt agreeable to neuropsychology visit CSW set up and would appreciate f/u with Dr. Sima Matas, as he is available.  Pt was appreciative of CSW's involvement and CSW will continue to follow pt to support her and assist with d/c planning.  Harrie Cazarez, Silvestre Mesi 03/16/2017, 1:08 AM

## 2017-03-16 NOTE — Progress Notes (Signed)
Pt resting in bed quietly. Easily aroused. Denies any pain at this time. Able to make needs known. No received for lexapro and medication education provided to pt with all questions asked. Without any noted adverse reactions noted this shift. Sternal incision noted that is well approximated and dry with 6 steri strips noted. # 20 to rfa noted and without any pain, heat, edema or erythema noted to site. Ambulatory with slow, steady gait. Son at bedside this shift. Continues on sternal precautions. Safety maintained. Expresses that she "slept really good tonight. Callbell within reach. Will continue to monitor.

## 2017-03-16 NOTE — Progress Notes (Signed)
Subjective/Complaints:  Feels much better with lexapro last night. Very alert this am, sitting eob  ROS: pt denies nausea, vomiting, diarrhea, cough, shortness of breath or chest pain    Objective: Vital Signs: Blood pressure 134/60, pulse 80, temperature 98.1 F (36.7 C), temperature source Oral, resp. rate 20, height  (1.778 m), weight 78.2 kg (172 lb 5.7 oz), SpO2 95 %. No results found. No results found for this or any previous visit (from the past 72 hour(s)).   HEENT: normal Cardio: RRR without murmur. No JVD  Resp: CTA Bilaterally without wheezes or rales. Normal effort  GI: BS positive and Non tender Extremity:  Pulses positive and No Edema Skin:   Intact, sternotomy healing well, dry Neuro: Alert/Oriented, Normal Sensory and Abnormal FMC Ataxic/ dec FMC 4+ BUE and BLE. Good sitting balance Musc/Skel:  Normal Gen NAD   Assessment/Plan: 1. 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 which require 3+ hours per day of interdisciplinary therapy in a comprehensive inpatient rehab setting. Physiatrist is providing close team supervision and 24 hour management of active medical problems listed below. Physiatrist and rehab team continue to assess barriers to discharge/monitor patient progress toward functional and medical goals. FIM: Function - Bathing Position: Shower Body parts bathed by patient: Right arm, Left arm, Chest, Abdomen, Front perineal area, Buttocks, Right upper leg, Left upper leg, Right lower leg, Left lower leg Body parts bathed by helper: Back Assist Level: Set up, Supervision or verbal cues Set up : To obtain items, To adjust water temperature, To open containers  Function- Upper Body Dressing/Undressing What is the patient wearing?: Pull over shirt/dress Pull over shirt/dress - Perfomed by patient: Thread/unthread right sleeve, Thread/unthread left sleeve, Put head through opening Pull over shirt/dress - Perfomed by helper: Put head through  opening, Pull shirt over trunk Assist Level: Supervision or verbal cues, Set up Set up : To obtain clothing/put away Function - Lower Body Dressing/Undressing What is the patient wearing?: Underwear, Pants, Shoes Position: Wheelchair/chair at sink Underwear - Performed by patient: Thread/unthread right underwear leg, Thread/unthread left underwear leg, Pull underwear up/down Underwear - Performed by helper: Thread/unthread right underwear leg Pants- Performed by patient: Thread/unthread right pants leg, Thread/unthread left pants leg, Pull pants up/down Pants- Performed by helper: Thread/unthread right pants leg Shoes - Performed by patient: Don/doff right shoe, Don/doff left shoe Assist for footwear: Supervision/touching assist Assist for lower body dressing: Supervision or verbal cues, Touching or steadying assistance (Pt > 75%)  Function - Toileting Toileting activity did not occur: Safety/medical concerns Toileting steps completed by patient: Adjust clothing prior to toileting, Adjust clothing after toileting, Performs perineal hygiene Toileting Assistive Devices: Grab bar or rail Assist level: Touching or steadying assistance (Pt.75%)  Function - Archivist transfer activity did not occur: Safety/medical concerns Toilet transfer assistive device: Bedside commode Assist level to toilet: No Help, no cues, assistive device, takes more than a reasonable amount of time Assist level from toilet: No Help, no cues, assistive device, takes more than a reasonable amount of time Assist level to bedside commode (at bedside): No Help, no cues, assistive device, takes more than a reasonable amount of time Assist level from bedside commode (at bedside): No Help, no cues, assistive device, takes more than a reasonable amount of time  Function - Chair/bed transfer Chair/bed transfer activity did not occur: N/A Chair/bed transfer method: Stand pivot, Ambulatory Chair/bed transfer  assist level: Touching or steadying assistance (Pt > 75%) Chair/bed transfer assistive device:  (heart pillow)  Chair/bed transfer details: Verbal cues for precautions/safety  Function - Locomotion: Wheelchair Will patient use wheelchair at discharge?: No Function - Locomotion: Ambulation Ambulation activity did not occur: Safety/medical concerns Assistive device: No device Max distance: 100 Assist level: No help, No cues, assistive device, takes more than a reasonable amount of time Assist level: Touching or steadying assistance (Pt > 75%) Walk 50 feet with 2 turns activity did not occur: Safety/medical concerns Assist level: Touching or steadying assistance (Pt > 75%) Walk 150 feet activity did not occur: Safety/medical concerns Assist level: Touching or steadying assistance (Pt > 75%) Walk 10 feet on uneven surfaces activity did not occur: Safety/medical concerns  Function - Comprehension Comprehension: Auditory Comprehension assist level: Follows complex conversation/direction with extra time/assistive device  Function - Expression Expression: Verbal Expression assist level: Expresses complex ideas: With extra time/assistive device  Function - Social Interaction Social Interaction assist level: Interacts appropriately with others - No medications needed.  Function - Problem Solving Problem solving assist level: Solves complex problems: With extra time  Function - Memory Memory assist level: More than reasonable amount of time Patient normally able to recall (first 3 days only): Current season, Location of own room, Staff names and faces, That he or she is in a hospital  Medical Problem List and Plan: 1.  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 CIR PT, OT, SLP. 2.  DVT Prophylaxis/Anticoagulation: Pharmaceutical: Lovenox- cardiology started Eliquis 3. Pain Management:mainly opper back pain and  incisional pain Will continue  ultram prn, warm Kpad for back 4. Mood: LCSW to follow for evaluation and support. 5. Neuropsych: This patient is not fully capable of making decisions on her own behalf. 6. Skin/Wound Care: Monitor wound daily for healing. Maintain adequate hydration and nutritional status 7. Fluids/Electrolytes/Nutrition: Monitor I/O. Check lytes in am. 8. A fib s/p DCCV: Monitor HR bid. Continue amiodarone. Metoprolol resumed today--monitor for orthostatic symptoms.  Reassured pt that she can do therapy today Cardiology on consult, had episode of RVR 9/17- f/u in one month from now, ~3wks post d/c  9. Anxiety disorder: Continue Xanax prn. remeron changed to lexapro---with good results so far.   -quite alert today  10. Seizure activity: On keppra bid for lifelong basis per neurology. 11. H/o Alcohol abuse: Has competed CIWA protocol.Pt states she drank in evenings to relax, "didn't realize how much I depended on it"  12.  Hypoalb start prostat- discussed rec to increase protein intake 13.  ABLA monitor Hgb 14.  S/p Bentall procedure, bioprosthetic valve  LOS (Days) 5 A FACE TO FACE EVALUATION WAS PERFORMED  SWARTZ,ZACHARY T 03/16/2017, 8:31 AM

## 2017-03-16 NOTE — Progress Notes (Signed)
Occupational Therapy Session Note  Patient Details  Name: Beth Austin MRN: [id number] Date of Birth: 03/31/1946  Today's Date: 03/16/2017 OT Individual Time: 1402-1450 OT Individual Time Calculation (min): 48 min   Skilled Therapeutic Interventions/Progress Updates:    Tx focus on standing tolerance, balance, hand coordination, and functional ambulation with device during functional tasks.   Pt greeted in w/c with family present. Declining B/D. She was escorted to dayroom, where she engaged in typing task at standing computer. With extra time, font magnified, and cues for [facility] navigation, pt typing up letter to the [employer] at the [employer] where she worked. Pt standing for 10 minutes during task before reporting pain in back and requesting to sit. She continued typing and editing while seated. Also navigated the internet browser and pulled up [employer] on [address] with instruction cues. Pt frustrated by decreased FMC, accuracy, and speed of movement, but motivated to remediate these skills. She often compensated by looking down at keyboard instead of at screen. Cues provided to acknowledge this. She then ambulated from dayroom back to her room on midwest unit (with RW and supervision). 1 rested break on couch in therapy apartment. Supervision sit<stand without UE assist. Once pt returned to room, she was left in recliner with family present.   Therapy Documentation Precautions:  Precautions Precautions: Sternal, Fall Precaution Comments: able to recall functional things she cannot do but unable to say "no push/lift" Restrictions Weight Bearing Restrictions: No RUE Weight Bearing: Non weight bearing LUE Weight Bearing: Non weight bearing Other Position/Activity Restrictions: sternal precautions Vital Signs: Therapy Vitals Temp: 98.2 F (36.8 C) Temp Source: Oral Pulse Rate: 75 Resp: 20 BP: (!) 102/49 Patient Position (if appropriate): Sitting Pain: Min back pain when  standing, subsided when sitting    ADL: ADL ADL Comments: see functional navigator     See Function Navigator for Current Functional Status.   Therapy/Group: Individual Therapy  [name] A Audianna Landgren 03/16/2017, 3:56 PM

## 2017-03-16 NOTE — Progress Notes (Signed)
[facility] Individual Statement of Services  Patient Name:  Beth Austin  Date:  03/16/2017  Welcome to the [facility].  Our goal is to provide you with an individualized program based on your diagnosis and situation, designed to meet your specific needs.  With this comprehensive rehabilitation program, you will be expected to participate in at least 3 hours of rehabilitation therapies Monday-Friday, with modified therapy programming on the weekends.  Your rehabilitation program will include the following services:  Physical Therapy (PT), Occupational Therapy (OT), Speech Therapy (ST), 24 hour per day rehabilitation nursing, Neuropsychology, Case Management (Social Worker), Rehabilitation Medicine, Nutrition Services and Pharmacy Services  Weekly team conferences will be held on Wednesdays to discuss your progress.  Your Social Worker will talk with you frequently to get your input and to update you on team discussions.  Team conferences with you and your family in attendance may also be held.  Expected length of stay:  10 to 13 days  Overall anticipated outcome:  supervision  Depending on your progress and recovery, your program may change. Your Social Worker will coordinate services and will keep you informed of any changes. Your Social Worker's name and contact numbers are listed  below.  The following services may also be recommended but are not provided by the [facility]:   Driving Evaluations  Home Health Rehabiltiation Services  Outpatient Rehabilitation Services  Vocational Rehabilitation   Arrangements will be made to provide these services after discharge if needed.  Arrangements include referral to agencies that provide these services.  Your insurance has been verified to be:  [place] Your primary doctor is:  In [place].  You will be following up with [facility] and [facility] physicians.  Pertinent information  will be shared with your doctor and your insurance company.  Social Worker:  Staci Acosta, LCSW  [phone] or ([phone]  Information discussed with and copy given to patient by: Elvera Lennox, 03/16/2017, 12:14 AM

## 2017-03-17 ENCOUNTER — Inpatient Hospital Stay (HOSPITAL_COMMUNITY): Payer: Managed Care, Other (non HMO) | Admitting: Occupational Therapy

## 2017-03-17 ENCOUNTER — Inpatient Hospital Stay (HOSPITAL_COMMUNITY): Payer: Managed Care, Other (non HMO)

## 2017-03-17 NOTE — Progress Notes (Signed)
Pt resting in  Bed quietly. Easily aroused. Denies any pain at this time. callbell within reach. Safety maintained. Full orthostatixc v/s obtained this shift. Am wt is 76.7 kg. c/o cough this shift that is nonproductive. # 20 rfa and patent. continues on sternal precautions. States that "I have had a very restful sleep tonight" when asked. Spouse at bedside. Swallows pills whole with thin liquids. continues on Eliquis on VTE. Refused hydrocortisone cream and prefers to use the vasoline. Will continue to monitor.

## 2017-03-17 NOTE — Progress Notes (Signed)
Occupational Therapy Session Note  Patient Details  Name: Beth Austin MRN: [id number] Date of Birth: 1945/07/09  Today's Date: 03/17/2017 OT Individual Time: 1300-1343 OT Individual Time Calculation (min): 43 min     Skilled Therapeutic Interventions/Progress Updates:    Pt ambulated down to the therapy gym with supervision and two standing rest breaks needed.  Once in the gym had pt work on standing balance and RUE coordination with use of Wii activity.  Mod demonstrational cueing initially for coordination of the right hand with controller.  Incorporated standing endurance for periods ranging from 1 minute to 3 mins.  Had pt perform stepping forward while active with RUE as well.  No LOB noted.  When having pt stand on foam surface however she did exhibit LOB requiring min assist to keep from falling posteriorly.  Ambulated back to room at conclusion of session with close supervision.  Pt left in room with call button and phone in reach and pt's son present as well.    Therapy Documentation Precautions:  Precautions Precautions: Sternal, Fall Precaution Comments: able to recall functional things she cannot do but unable to say "no push/lift" Restrictions Weight Bearing Restrictions: No RUE Weight Bearing: Non weight bearing LUE Weight Bearing: Non weight bearing Other Position/Activity Restrictions: sternal precautions  Pain: Pain Assessment Pain Assessment: No/denies pain ADL: ADL ADL Comments: see functional navigator VisionSee Function Navigator for Current Functional Status.   Therapy/Group: Individual Therapy  Yanna Leaks OTR/L 03/17/2017, 3:42 PM

## 2017-03-17 NOTE — Progress Notes (Signed)
Subjective/Complaints:  Had another great night of sleep. Feels well this morning. Again sitting EOB.   ROS: pt denies nausea, vomiting, diarrhea, cough, shortness of breath or chest pain    Objective: Vital Signs: Blood pressure (!) 102/49, pulse 75, temperature 99.2 F (37.3 C), resp. rate 18, height  (1.778 m), weight 76.7 kg (169 lb 1.5 oz), SpO2 93 %. No results found. No results found for this or any previous visit (from the past 72 hour(s)).   HEENT: normal Cardio: RRR without murmur. No JVD   Resp: CTA Bilaterally without wheezes or rales. Normal effort  GI: BS positive and Non tender Extremity:  Pulses positive and No Edema Skin:   Intact, sternotomy healing well, dry Neuro: Alert/Oriented, Normal Sensory and Abnormal FMC Ataxic/ dec FMC 4+ BUE and BLE. Good sitting balance Musc/Skel:  Normal Gen NAD   Assessment/Plan: 1. 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 which require 3+ hours per day of interdisciplinary therapy in a comprehensive inpatient rehab setting. Physiatrist is providing close team supervision and 24 hour management of active medical problems listed below. Physiatrist and rehab team continue to assess barriers to discharge/monitor patient progress toward functional and medical goals. FIM: Function - Bathing Position: Shower Body parts bathed by patient: Right arm, Left arm, Chest, Abdomen, Front perineal area, Buttocks, Right upper leg, Left upper leg, Right lower leg, Left lower leg Body parts bathed by helper: Back Assist Level: Set up, Supervision or verbal cues Set up : To obtain items, To adjust water temperature, To open containers  Function- Upper Body Dressing/Undressing What is the patient wearing?: Pull over shirt/dress Pull over shirt/dress - Perfomed by patient: Thread/unthread right sleeve, Thread/unthread left sleeve, Put head through opening Pull over shirt/dress - Perfomed by helper: Put head through opening, Pull  shirt over trunk Assist Level: Supervision or verbal cues, Set up Set up : To obtain clothing/put away Function - Lower Body Dressing/Undressing What is the patient wearing?: Underwear, Pants, Shoes Position: Wheelchair/chair at sink Underwear - Performed by patient: Thread/unthread right underwear leg, Thread/unthread left underwear leg, Pull underwear up/down Underwear - Performed by helper: Thread/unthread right underwear leg Pants- Performed by patient: Thread/unthread right pants leg, Thread/unthread left pants leg, Pull pants up/down Pants- Performed by helper: Thread/unthread right pants leg Shoes - Performed by patient: Don/doff right shoe, Don/doff left shoe Assist for footwear: Supervision/touching assist Assist for lower body dressing: Supervision or verbal cues, Touching or steadying assistance (Pt > 75%)  Function - Toileting Toileting activity did not occur: Safety/medical concerns Toileting steps completed by patient: Adjust clothing prior to toileting, Performs perineal hygiene, Adjust clothing after toileting Toileting Assistive Devices: Grab bar or rail Assist level: More than reasonable time  Function - Archivist transfer activity did not occur: Safety/medical concerns Toilet transfer assistive device: Bedside commode Assist level to toilet: No Help, no cues, assistive device, takes more than a reasonable amount of time Assist level from toilet: No Help, no cues, assistive device, takes more than a reasonable amount of time Assist level to bedside commode (at bedside): No Help, no cues, assistive device, takes more than a reasonable amount of time Assist level from bedside commode (at bedside): No Help, no cues, assistive device, takes more than a reasonable amount of time  Function - Chair/bed transfer Chair/bed transfer activity did not occur: N/A Chair/bed transfer method: Stand pivot, Ambulatory Chair/bed transfer assist level: Touching or steadying  assistance (Pt > 75%) Chair/bed transfer assistive device:  (heart pillow) Chair/bed  transfer details: Verbal cues for precautions/safety  Function - Locomotion: Wheelchair Will patient use wheelchair at discharge?: No Function - Locomotion: Ambulation Ambulation activity did not occur: Safety/medical concerns Assistive device: No device Max distance: 100 Assist level: No help, No cues, assistive device, takes more than a reasonable amount of time Assist level: Touching or steadying assistance (Pt > 75%) Walk 50 feet with 2 turns activity did not occur: Safety/medical concerns Assist level: Touching or steadying assistance (Pt > 75%) Walk 150 feet activity did not occur: Safety/medical concerns Assist level: Touching or steadying assistance (Pt > 75%) Walk 10 feet on uneven surfaces activity did not occur: Safety/medical concerns  Function - Comprehension Comprehension: Auditory Comprehension assist level: Follows complex conversation/direction with extra time/assistive device  Function - Expression Expression: Verbal Expression assist level: Expresses complex ideas: With extra time/assistive device  Function - Social Interaction Social Interaction assist level: Interacts appropriately with others - No medications needed.  Function - Problem Solving Problem solving assist level: Solves complex problems: Recognizes & self-corrects  Function - Memory Memory assist level: More than reasonable amount of time Patient normally able to recall (first 3 days only): Current season, Location of own room, Staff names and faces, That he or she is in a hospital  Medical Problem List and Plan: 1.  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 CIR PT, OT, SLP. 2.  DVT Prophylaxis/Anticoagulation: Pharmaceutical: Lovenox- cardiology started Eliquis 3. Pain Management:mainly opper back pain and incisional pain Will  continue  ultram prn, warm Kpad for back 4. Mood: LCSW to follow for evaluation and support. 5. Neuropsych: This patient is not fully capable of making decisions on her own behalf. 6. Skin/Wound Care: Monitor wound daily for healing. Maintain adequate hydration and nutritional status 7. Fluids/Electrolytes/Nutrition: Monitor I/O. Check lytes in am. 8. A fib s/p DCCV: Monitor HR bid. Continue amiodarone. Metoprolol resumed today--monitor for orthostatic symptoms.  Reassured pt that she can do therapy today Cardiology on consult, had episode of RVR 9/17- f/u in one month from now, ~3wks post d/c  9. Anxiety disorder: Continue Xanax prn. remeron changed to lexapro---with excellent results.   10. Seizure activity: On keppra bid for lifelong basis per neurology. 11. H/o Alcohol abuse: Has competed CIWA protocol.Pt states she drank in evenings to relax, "didn't realize how much I depended on it"  12.  Hypoalb start prostat- encourage PO 13.  ABLA monitor Hgb 14.  S/p Bentall procedure, bioprosthetic valve  LOS (Days) 6 A FACE TO FACE EVALUATION WAS PERFORMED  SWARTZ,ZACHARY T 03/17/2017, 8:21 AM

## 2017-03-17 NOTE — Progress Notes (Signed)
Physical Therapy Session Note  Patient Details  Name: Beth Austin MRN: [id number] Date of Birth: [birth year]  Today's Date: 03/17/2017 PT Individual Time: 1100-1156, 1500-1540 PT Individual Time Calculation (min): 56 min, 40 min   Short Term Goals: Week 1:  PT Short Term Goal 1 (Week 1): Pt will complete DGI balance assessment PT Short Term Goal 2 (Week 1): Pt will negotiate 12 steps with min assist PT Short Term Goal 3 (Week 1): Pt will recall 3/3 sternal precautions without cues PT Short Term Goal 4 (Week 1): Pt will ambulate 100' with min assist   Skilled Therapeutic Interventions/Progress Updates:    Session 1: Pt seated in recliner upon PT arrival, agreeable to therapy tx and denies pain. Pt ambulated from room to ortho gym without AD and with min assist, pt reporting feeling very weak and reaching for railing. Agreeable to seated therex at edge of mat: 2 x 20 LAQ, x 2 hip flexion, 1 x 10 LAQ with 3lb weight, hip abduction with orange theraband 2 x 10, standing hip adbuction 2 x10. Pt used nustep x 10 minutes on workload 3, LEs only for LE strengthening and activity tolerance. Pt transferred from w/c back to recliner with supervision, left seated in recliner with needs in reach.   Session 2: Pt supine in bed upon PT arrival, stated no pain but not feeling well and requesting to shower this session. Pt ambulated to and from bathroom without AD and with supervision. Pt turned on shower and PT assisted with set up to gather supplies/towels while pt carried clean clothes. Bathing completed shower level with verbal cues for safe technique and to maintain sternal precautions with stand. Pt also needed assist with opening and obtaining containers 2/2 decreased hand strength. Dressing completed with demonstration for modified strategy and min A to thread R LE into pants. Pt maintained dynamic standing balance with supervision in order to brush teethand blow dry hair. Pt left seated EOB at end of  session with family present.  Therapy Documentation Precautions:  Precautions Precautions: Sternal, Fall Precaution Comments: able to recall functional things she cannot do but unable to say "no push/lift" Restrictions Weight Bearing Restrictions: No RUE Weight Bearing: Non weight bearing LUE Weight Bearing: Non weight bearing Other Position/Activity Restrictions: sternal precautions   See Function Navigator for Current Functional Status.   Therapy/Group: Individual Therapy  Cresenciano Genre, PT, DPT 03/17/2017, 11:41 AM

## 2017-03-17 NOTE — Progress Notes (Signed)
Occupational Therapy Session Note  Patient Details  Name: Beth Austin MRN: [id number] Date of Birth: Nov 05, 1945  Today's Date: 03/17/2017 OT Individual Time: 6295-2841 OT Individual Time Calculation (min): 55 min    Skilled Therapeutic Interventions/Progress Updates:    pt completed functional mobility to the therapy gym with supervision and no assistive device during session.  One standing rest break needed at the nurses station to complete mobility.  Once in the gym had pt sit on the mat and work on RUE FM coordination task of nine hole peg test.  She was able to complete with the left hand in 30 seconds and with the right hand in 37 seconds.  Had her work on Statistician, picking pegs up one at a time and translating to palm, then placing them in peg board.  Pt with occasional drops and mod instructional cueing to follow directions specifically and work the peg all the way out to her fingertips.  Progressed to gross and FM coordination with ball toss and catch in sitting, first with medium sized ball and then with tennis ball.  Pt able to toss and catch medium sized ball with 100 percent accuracy in sitting but only 75% accuracy with smaller tennis ball.  Had pt stand to complete with husband tossing medium ball to her as well as well as having her walk and toss.  She needed increased time to walk and toss/catch it with short steps noted.  Added in cognitive component with walking ball toss having her count back from 100 by 3s each toss she had.  Multiple times she had to stop to process cognitive task and missed the correct number 3 times.  Finished session with ambulation back to the room with 2 standing rest breaks needed.  She was left in bedside recliner with spouse present to conclude session.    Therapy Documentation Precautions:  Precautions Precautions: Sternal, Fall Precaution Comments: able to recall functional things she cannot do but unable to say "no  push/lift" Restrictions Weight Bearing Restrictions: No RUE Weight Bearing: Non weight bearing LUE Weight Bearing: Non weight bearing Other Position/Activity Restrictions: sternal precautions Pain: Pain Assessment Pain Assessment: No/denies pain ADL: See Function Navigator for Current Functional Status.   Therapy/Group: Individual Therapy  Khan Chura OTR/L 03/17/2017, 12:22 PM

## 2017-03-18 ENCOUNTER — Inpatient Hospital Stay (HOSPITAL_COMMUNITY): Payer: Managed Care, Other (non HMO) | Admitting: Physical Therapy

## 2017-03-18 ENCOUNTER — Inpatient Hospital Stay (HOSPITAL_COMMUNITY): Payer: Managed Care, Other (non HMO) | Admitting: Occupational Therapy

## 2017-03-18 LAB — BASIC METABOLIC PANEL
ANION GAP: 8 (ref 5–15)
BUN: 15 mg/dL (ref 6–20)
CHLORIDE: 105 mmol/L (ref 101–111)
CO2: 23 mmol/L (ref 22–32)
Calcium: 8.4 mg/dL — ABNORMAL LOW (ref 8.9–10.3)
Creatinine, Ser: 0.72 mg/dL (ref 0.44–1.00)
GFR calc Af Amer: >60 mL/min (ref >60)
GFR calc non Af Amer: >60 mL/min (ref >60)
GLUCOSE: 111 mg/dL — AB (ref 65–99)
POTASSIUM: 3.9 mmol/L (ref 3.5–5.1)
Sodium: 136 mmol/L (ref 135–145)

## 2017-03-18 LAB — CBC
HEMATOCRIT: 25.2 % — AB (ref 36.0–46.0)
HEMOGLOBIN: 8 g/dL — AB (ref 12.0–15.0)
MCH: 29 pg (ref 26.0–34.0)
MCHC: 31.7 g/dL (ref 30.0–36.0)
MCV: 91.3 fL (ref 78.0–100.0)
Platelets: 303 10*3/uL (ref 150–400)
RBC: 2.76 MIL/uL — AB (ref 3.87–5.11)
RDW: 13.9 % (ref 11.5–15.5)
WBC: 7.3 10*3/uL (ref 4.0–10.5)

## 2017-03-18 MED ORDER — AMIODARONE HCL 200 MG PO TABS
200.0000 mg | ORAL_TABLET | Freq: Every day | ORAL | Status: DC
  Administered 2017-03-19 – 2017-03-21 (×3): 200 mg via ORAL
  Filled 2017-03-18 (×3): qty 1

## 2017-03-18 NOTE — Progress Notes (Signed)
Subjective/Complaints:  No issues overnite, we discussed signs and symptoms of valvular leak as well as drop in hgb  ROS: pt denies nausea, vomiting, diarrhea, cough, shortness of breath or chest pain    Objective: Vital Signs: Blood pressure 121/66, pulse 78, temperature 98.2 F (36.8 C), temperature source Oral, resp. rate 18, height 5' 10"  (1.778 m), weight 76.7 kg (169 lb 1.5 oz), SpO2 94 %. No results found. Results for orders placed or performed during the hospital encounter of 03/11/17 (from the past 72 hour(s))  CBC     Status: Abnormal   Collection Time: 03/18/17 12:18 AM  Result Value Ref Range   WBC 7.3 4.0 - 10.5 K/uL   RBC 2.76 (L) 3.87 - 5.11 MIL/uL   Hemoglobin 8.0 (L) 12.0 - 15.0 g/dL   HCT 25.2 (L) 36.0 - 46.0 %   MCV 91.3 78.0 - 100.0 fL   MCH 29.0 26.0 - 34.0 pg   MCHC 31.7 30.0 - 36.0 g/dL   RDW 13.9 11.5 - 15.5 %   Platelets 303 150 - 400 K/uL  Basic metabolic panel     Status: Abnormal   Collection Time: 03/18/17 12:18 AM  Result Value Ref Range   Sodium 136 135 - 145 mmol/L   Potassium 3.9 3.5 - 5.1 mmol/L   Chloride 105 101 - 111 mmol/L   CO2 23 22 - 32 mmol/L   Glucose, Bld 111 (H) 65 - 99 mg/dL   BUN 15 6 - 20 mg/dL   Creatinine, Ser 0.72 0.44 - 1.00 mg/dL   Calcium 8.4 (L) 8.9 - 10.3 mg/dL   GFR calc non Af Amer >60 >60 mL/min   GFR calc Af Amer >60 >60 mL/min    Comment: (NOTE) The eGFR has been calculated using the CKD EPI equation. This calculation has not been validated in all clinical situations. eGFR's persistently <60 mL/min signify possible Chronic Kidney Disease.    Anion gap 8 5 - 15     HEENT: normal Cardio: RRR without murmur. No JVD   Resp: CTA Bilaterally without wheezes or rales. Normal effort  GI: BS positive and Non tender Extremity:  Pulses positive and No Edema Skin:   Intact, sternotomy healing well, dry Neuro: Alert/Oriented, Normal Sensory and Abnormal FMC Ataxic/ dec FMC 4+ BUE and BLE. Good sitting  balance Musc/Skel:  Normal Gen NAD   Assessment/Plan: 1. 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 which require 3+ hours per day of interdisciplinary therapy in a comprehensive inpatient rehab setting. Physiatrist is providing close team supervision and 24 hour management of active medical problems listed below. Physiatrist and rehab team continue to assess barriers to discharge/monitor patient progress toward functional and medical goals. FIM: Function - Bathing Position: Shower Body parts bathed by patient: Right arm, Left arm, Chest, Abdomen, Front perineal area, Buttocks, Right upper leg, Left upper leg, Right lower leg, Left lower leg Body parts bathed by helper: Back Assist Level: Set up, Supervision or verbal cues Set up : To obtain items, To adjust water temperature, To open containers  Function- Upper Body Dressing/Undressing What is the patient wearing?: Pull over shirt/dress Pull over shirt/dress - Perfomed by patient: Thread/unthread right sleeve, Thread/unthread left sleeve, Put head through opening Pull over shirt/dress - Perfomed by helper: Put head through opening, Pull shirt over trunk Assist Level: Supervision or verbal cues, Set up Set up : To obtain clothing/put away Function - Lower Body Dressing/Undressing What is the patient wearing?: Underwear, Pants, Shoes Position: Wheelchair/chair at  sink Underwear - Performed by patient: Thread/unthread right underwear leg, Thread/unthread left underwear leg, Pull underwear up/down Underwear - Performed by helper: Thread/unthread right underwear leg Pants- Performed by patient: Thread/unthread right pants leg, Thread/unthread left pants leg, Pull pants up/down Pants- Performed by helper: Thread/unthread right pants leg Shoes - Performed by patient: Don/doff right shoe, Don/doff left shoe Assist for footwear: Supervision/touching assist Assist for lower body dressing: Supervision or verbal cues, Touching or steadying  assistance (Pt > 75%)  Function - Toileting Toileting activity did not occur: Safety/medical concerns Toileting steps completed by patient: Adjust clothing prior to toileting, Performs perineal hygiene, Adjust clothing after toileting Toileting Assistive Devices: Grab bar or rail Assist level: More than reasonable time  Function - Air cabin crew transfer activity did not occur: Safety/medical concerns Toilet transfer assistive device: Bedside commode Assist level to toilet: No Help, no cues, assistive device, takes more than a reasonable amount of time Assist level from toilet: No Help, no cues, assistive device, takes more than a reasonable amount of time Assist level to bedside commode (at bedside): No Help, no cues, assistive device, takes more than a reasonable amount of time Assist level from bedside commode (at bedside): No Help, no cues, assistive device, takes more than a reasonable amount of time  Function - Chair/bed transfer Chair/bed transfer activity did not occur: N/A Chair/bed transfer method: Stand pivot, Ambulatory Chair/bed transfer assist level: Supervision or verbal cues Chair/bed transfer assistive device:  (heart pillow) Chair/bed transfer details: Verbal cues for precautions/safety  Function - Locomotion: Wheelchair Will patient use wheelchair at discharge?: No Function - Locomotion: Ambulation Ambulation activity did not occur: Safety/medical concerns Assistive device: No device Max distance: 100 Assist level: Touching or steadying assistance (Pt > 75%) Assist level: Touching or steadying assistance (Pt > 75%) Walk 50 feet with 2 turns activity did not occur: Safety/medical concerns Assist level: Touching or steadying assistance (Pt > 75%) Walk 150 feet activity did not occur: Safety/medical concerns Assist level: Touching or steadying assistance (Pt > 75%) Walk 10 feet on uneven surfaces activity did not occur: Safety/medical concerns  Function -  Comprehension Comprehension: Auditory Comprehension assist level: Follows complex conversation/direction with no assist  Function - Expression Expression: Verbal Expression assist level: Expresses complex ideas: With no assist  Function - Social Interaction Social Interaction assist level: Interacts appropriately with others - No medications needed.  Function - Problem Solving Problem solving assist level: Solves complex problems: Recognizes & self-corrects  Function - Memory Memory assist level: More than reasonable amount of time Patient normally able to recall (first 3 days only): Current season, Location of own room, Staff names and faces, That he or she is in a hospital  Medical Problem List and Plan: 1.  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 CIR PT, OT, SLP. 2.  DVT Prophylaxis/Anticoagulation: Pharmaceutical: Lovenox- cardiology started Eliquis 3. Pain Management:mainly opper back pain and incisional pain Will continue  ultram prn, warm Kpad for back 4. Mood: LCSW to follow for evaluation and support. 5. Neuropsych: This patient is not fully capable of making decisions on her own behalf. 6. Skin/Wound Care: Monitor wound daily for healing. Maintain adequate hydration and nutritional status 7. Fluids/Electrolytes/Nutrition: Monitor I/O. Check lytes in am. 8. A fib s/p DCCV: Monitor HR bid. Continue amiodarone. Metoprolol resumed today--monitor for orthostatic symptoms.  Reassured pt that she can do therapy today Cardiology on consult, had episode of RVR 9/17- f/u~3wks post d/c  9. Anxiety disorder: Continue Xanax prn. remeron changed to lexapro---with excellent results.   10. Seizure activity: On keppra bid for lifelong basis per neurology. 11. H/o Alcohol abuse: Has competed CIWA protocol.Pt states she drank in evenings to relax, "didn't realize how much I depended on it"  12.  Hypoalb start  prostat- encourage PO 13.  ABLA stable  Hgb at 8.0   14.  S/p Bentall procedure, bioprosthetic valve  LOS (Days) 7 A FACE TO FACE EVALUATION WAS PERFORMED  KIRSTEINS,ANDREW E 03/18/2017, 7:36 AM

## 2017-03-18 NOTE — Progress Notes (Signed)
Patient reports she does not like prostat. Patient noted with poor to fair appetite. Patient has tried ensure and reports it gives her loose bowels. Patient also tried [employer] vanilla pudding for supplemental intake. Patient reports she will eat pudding over ensure. Continue with plan of care. Beth Austin

## 2017-03-18 NOTE — Progress Notes (Signed)
Physical Therapy Session Note  Patient Details  Name: Beth Austin MRN: [id number] Date of Birth: 12/29/1945  Today's Date: 03/18/2017 PT Individual Time: 1415-1545 PT Individual Time Calculation (min): 90 min   Short Term Goals: Week 1:  PT Short Term Goal 1 (Week 1): Pt will complete DGI balance assessment PT Short Term Goal 2 (Week 1): Pt will negotiate 12 steps with min assist PT Short Term Goal 3 (Week 1): Pt will recall 3/3 sternal precautions without cues PT Short Term Goal 4 (Week 1): Pt will ambulate 100' with min assist   Skilled Therapeutic Interventions/Progress Updates:    no c/o pain throughout session, session focused on functional mobility and activity tolerance.  Pt transferred throughout session with supervision, including sit > stand. Pt ambulated 162' from patient room to gym with rollator and supervision, maintaining upright posture without requiring additional cuing. Pt performed Dynamic Gait Index (see below) with rest breaks between tasks due to decreased activity tolerance. Pt educated on results of DGI and recommendation for use of rollator. Pt performed standing exercises of sit > stand x 10 without using any UEs, Figure 8 walking with rollator x 2, and SLS x 10 sec x 2 on each leg with single UE support. Pt displayed Trendelenburg stance when performing SLS. Pt transferred to sidelying on mat table, performing clamshells on each LE x 10 reps x 2 sets. Pt performed bridging exercise in hooklying x 10 reps x 2 sets, requiring rest breaks between exercises; tasks used to increase LE and hip strength. Pt transferred sidelying > sit edge of mat with contact guard assist to initiate coming to sitting. Pt worked on dynamic sitting balance by playing 'volleyball' while sitting at the edge of the mat table, requiring rest breaks between bouts of tapping the ball back and forth. Pt ambulated 162' back to patient room with rollator and supervision, returned to sitting in recliner  with call bell and all needs addressed. Pt's son educated on transfers within room and bathroom and expressed understanding. Updated safety plan to reflect son's training.   Therapy Documentation Precautions:  Precautions Precautions: Sternal, Fall Precaution Comments: able to recall functional things she cannot do but unable to say "no push/lift" Restrictions Weight Bearing Restrictions: No RUE Weight Bearing: Non weight bearing LUE Weight Bearing: Non weight bearing Other Position/Activity Restrictions: sternal precautions    Balance: Standardized Balance Assessment Standardized Balance Assessment: Dynamic Gait Index Dynamic Gait Index Level Surface: Mild Impairment Change in Gait Speed: Mild Impairment Gait with Horizontal Head Turns: Moderate Impairment Gait with Vertical Head Turns: Moderate Impairment Gait and Pivot Turn: Normal Step Over Obstacle: Moderate Impairment Step Around Obstacles: Normal Steps: Mild Impairment Total Score: 15   See Function Navigator for Current Functional Status.   Therapy/Group: Individual Therapy  [name] 03/18/2017, 4:09 PM

## 2017-03-18 NOTE — Progress Notes (Addendum)
Occupational Therapy Session Note  Patient Details  Name: Beth Austin MRN: [id number] Date of Birth: 1945-11-23  Today's Date: 03/18/2017  Session 1 OT Individual Time: 0905-1000 OT Individual Time Calculation (min): 55 min   Session 2 OT Individual Time: 5462-7035 OT Individual Time Calculation (min): 43 min    Short Term Goals: Week 1:   STG=LTG2/2 ELOS  Skilled Therapeutic Interventions/Progress Updates:     Session 1 OT treatment session focused on dc planning, activity tolerance, and R hand fine motor coordination. Discussed dc plan again with pt and partner who now state that they are unsure where they will stay at dc because they did not like hotel. Pt's partner is looking into other hotels and rentals in the area. Discussed DME needs for bathroom which is tricky because pt will be traveling. Pt stood with supervision and took a few steps towards the door, then stated she felt extremely dizzy. Chair brought behind pt and blood pressure checked. BP sitting 93/50 HR 78. Educated pt on ankle pump strategy and provided pt with water. Pt stood again and BP checked in standing at 82/45 HR 103, RN notified. Pt returned to sitting and symptoms improved. Addressed R fine motor coordination with thera-putty and fine motor home program. Pt reported improved status and worked with putty in standing with dizziness. Pt then completed 3 sets of 10 sit<>stands with rest breaks in between.  Session 2 OT provided pt with rollator walker for endurance. Educated pt on energy conservation techniques and demonstrated rollator functions. Sit<>stand from recliner with supervision and no UE support. Pt ambulated with rollator without assistance with 1 seated rest break on the way to therapy apartment. Discussed dc plan and bathroom set-up possibilities and practiced tub shower transfer with tub bench. Decided tub bench will be the most versatile option for pt. Pt then worked on R fine motor coordination with  graded peg board activity. Pt returned to room and left seated in recliner with needs met and family present.  Therapy Documentation Precautions:  Precautions Precautions: Sternal, Fall Precaution Comments: able to recall functional things she cannot do but unable to say "no push/lift" Restrictions Weight Bearing Restrictions: No RUE Weight Bearing: Non weight bearing LUE Weight Bearing: Non weight bearing Other Position/Activity Restrictions: sternal precautions ADL: ADL ADL Comments: see functional navigator  See Function Navigator for Current Functional Status.   Therapy/Group: Individual Therapy  Valma Cava 03/18/2017, 3:56 PM

## 2017-03-18 NOTE — Consult Note (Signed)
Neuropsychological Consultation   Patient:   Beth Austin   DOB:   1946/02/15  MR Number:  [id number]  Location:  [facility] [address] [facility] [id number] [place] [address] Dept: [phone] Loc: [phone]           Date of Service:   03/13/2017  Start Time:   1 PM End Time:   2 PM  Provider/Observer:  Arley Phenix, Psy.D.       Clinical Neuropsychologist       Billing Code/Service: [phone] 4 Units  Chief Complaint:    Beth Austin is a 71 year old female who was initially admitted to [facility] on 02/26/2017 following seizure, pain radiating from chest and back and right lower extremity due to aortic dissection. The patient had been on a flight from [place] to [place]. The patient is reported to of started seizing on the plain and the plan had to be diverted to RDU. The patient was treated initially in the emergency department and then received vascular surgery for the aortic dissection. The patient did continue to have seizure primarily on right sided motor activity on 02/28/2017. She was placed on Keppra. The patient was later transported for comprehensive rehabilitation program at [facility] health. There are reports of family members that the patient had some type of seizures as much is 10 years ago and they were described today to be similar to absence seizures and/or temporal lobe seizures. 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   The patient had been returning from [place] after what she described as a tumultuous situation with her daughter. Patient reports that she was very upset after leaving [place] and developed panic attacks on the airplane. The patient continues to verbally blame her daughter for the medical event she experienced. However, the patient does acknowledge that she has had panic attacks in the past.  She reports that she stopped having them she began drinking a bottle of wine every night. The patient denies that she had a substance abuse problem and reports that she drank this much alcohol every day is both medication for her panic attacks which have been stopped by this alcohol consumption as well as "just something people do in [place]".  The patient was very upset today when both myself as well as one of her treating physicians told her that she should not return to consuming alcohol and that it could be very detrimental to the effectiveness of her Keppra as well as increase her seizure risk and enough itself.   Reason for Service:  The patient was referred for neuropsychological consult due to her ongoing emotional distress including significant anxiety, anger, reports of panic attacks, inability to sleep at night, and general frustration/anger over all of the events that led to her being in the rehabilitation program. The patient did not voice any frustrations directly about the rehabilitation program but the events that led up to her vascular events. Below is the history of present illness for the current admission.  HPI:  Beth Austin is a 71 year old female who was emergently admitted to [facility] 02/26/2017 with seizure, pain radiating from chest to back and RLE due to aortic dissection. She was flight from [place] to [place], started seizing and plane has to be diverted to RDU.  She was intubated in ED, RLE noted to be cold and taken  to OR emergently for repair of thoracoabdominal aortic dissection with Bental procedure and AVR by Dr. Nolon Rod. 2D echo with EF 40-45% with mild LVH, mild to moderately global LVD and mild MVR/TVR.  She continued to have seizure with right sided motor activity on 9/6 and neurology recommended increasing Keppra to 1000 mg bid. To remain on keppra lifelong due to family reports of episodes suggestive of epilepsy going back to 10 years. Follow up MRI brain 9/13  revealing acute/subacute infarct in left posterior limb internal capsule. She developed A fib/A flutter requiring DCCV 9/11 by Dr.  and continues on BB and amiodarone for rate control. Anxiety/ insomnia being managed with prn xanax. Fluid overload treated with diuresis. Pre op EF < 40%--blood pressures remain on low side--SBP< 100,  limiting ACE/ARB use.    Current Status:  During the clinical interview today which also included her significant other and one of her sons the patient clearly described significant and ongoing anger at her daughter that she had been visiting just before she left from [place] on an airplane. The patient made statements suggesting that she wishes that she had died from this event and relationship to frustrations with her daughter and wanting to get revenge against her daughter stating that she felt like her daughter was the reason why she had this happen. In any event there is clearly a lot of frustration and friction between the daughter and the patient. The patient's significant other and the patient both indicated that they felt that the amount of alcohol there were consuming on a regular basis was not a major issue and the patient felt that it was very therapeutic for her and caused her not to have panic attacks.  Behavioral Observation: Beth Austin  presents as a 72 y.o.-year-old Right Caucasian Female who appeared her stated age. her dress was Appropriate and she was Well Groomed and her manners were Appropriate, inappropriate, unnecessary at this time to the situation.  her participation was indicative of Intrusive and Monopolizing behaviors.  There were physical disabilities noted.   After about 30 minutes she became more receptive to full bidirectional discussions and  she began to display a more appropriate level of cooperation and motivation.     Interactions:    Active Monopolizing  Attention:   within normal limits and attention span appeared shorter than expected  for age  Memory:   within normal limits; recent and remote memory intact  Visuo-spatial:  not examined  Speech (Volume):  high  Speech:   normal; rapid  Thought Process:  Tangential  Though Content:  Obsessions; while the patient made vague and indirect insinuation that she felt like she should've died or be better off if she had died the patient denied that she had any intention or plan to initiate any self harming behavior. She did describe great anger towards her daughter and a desire for indirect revenge but also never indicated any impulses or desire to directly harm her daughter.  Orientation:   person, place, time/date and situation  Judgment:   Poor  Planning:   Poor  Affect:    Anxious, Defensive, Irritable and Labile  Mood:    Angry, Depressed and Irritable  Insight:   Shallow  Intelligence:   high  Marital Status/Living: The patient has been divorced for many years from her husband. She has 3 grown children. She has a very poor relationship with a least one of her daughters. The patient has been a long-term relationship with  another gentleman.  Current Employment: The patient works as a Warden/ranger courses in the travel industry.  Substance Use:  There is a documented history of alcohol abuse confirmed by the patient.  The patient reports that she drinks 1 bottle of wine each day. It is possible that there are frequent times where she consumes more than this.  Education:   Control and instrumentation engineer History:   Past Medical History:  Diagnosis Date  . Alcohol abuse   . Dvt femoral (deep venous thrombosis) (HCC)    after tibial fracture  . Dysrhythmia   . Generalized anxiety disorder with panic attacks   . GERD (gastroesophageal reflux disease)   . Hypertension   . PAF (paroxysmal atrial fibrillation) (HCC)    resolved with carotid massage in the past  . Right tibial fracture 1997   treated with cast        Psychiatric History:  The patient reports  that she has a history of panic attacks going back many years but reports that the panic attacks were held in check by her medicinal use of alcohol every day. There also suggestions that she may have had seizures in the past but it is unclear whether these seizures were due to epilepsy type condition or alcohol withdrawal symptoms.  Family Med/Psych History:  Family History  Problem Relation Age of Onset  . AAA (abdominal aortic aneurysm) Mother 37       diet of rupture  . Lung cancer Father 58  . Colon cancer Sister   . Stroke Maternal Grandmother     Risk of Suicide/Violence: moderate the patient denies any current suicidal or homicidal ideation. However, she does frequently make statements about how she wishes she had died due to the negative impact it would have on her daughter.  Impression/DX:  Beth Austin is a 71 year old female who was initially admitted to [facility] on 02/26/2017 following seizure, pain radiating from chest and back and right lower extremity due to aortic dissection. The patient had been on a flight from [place] to [place]. The patient is reported to of started seizing on the plain and the plan had to be diverted to RDU. The patient was treated initially in the emergency department and then received vascular surgery for the aortic dissection. The patient did continue to have seizure primarily on right sided motor activity on 02/28/2017. She was placed on Keppra. The patient was later transported for comprehensive rehabilitation program at [facility] health. There are reports of family members that the patient had some type of seizures as much is 10 years ago and they were described today to be similar to absence seizures and/or temporal lobe seizures. 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   The patient had been returning from [place] after what she described as a tumultuous situation  with her daughter. Patient reports that she was very upset after leaving [place] and developed panic attacks on the airplane. The patient continues to verbally blame her daughter for the medical event she experienced. However, the patient does acknowledge that she has had panic attacks in the past. She reports that she stopped having them she began drinking a bottle of wine every night. The patient denies that she had a substance abuse problem and reports that she drank this much alcohol every day is both medication for her panic attacks which have been stopped by this alcohol consumption as well as "  just something people do in [place]".  The patient was very upset today when both myself as well as one of her treating physicians told her that she should not return to consuming alcohol and that it could be very detrimental to the effectiveness of her Keppra as well as increase her seizure risk and enough itself.    03/18/2017 During the clinical interview today which also included her significant other and one of her sons the patient clearly described significant and ongoing anger at her daughter that she had been visiting just before she left from [place] on an airplane. The patient made statements suggesting that she wishes that she had died from this event and relationship to frustrations with her daughter and wanting to get revenge against her daughter stating that she felt like her daughter was the reason why she had this happen. In any event there is clearly a lot of frustration and friction between the daughter and the patient. The patient's significant other and the patient both indicated that they felt that the amount of alcohol there were consuming on a regular basis was not a major issue and the patient felt that it was very therapeutic for her and caused her not to have panic attacks.  03/18/2017  Today the patient was much more calm.  While she continued to express negative feelings about her daughter  in [place] she reports that she still plans to move to [facility] at some point.  The patient was less agitated today and wwe continued to work on Pharmacologist.    Diagnosis:    Cerebrovascular accident (CVA) due to embolism of other precerebral artery (HCC) - Plan: Ambulatory referral to Physical Medicine Rehab      Panic Disorder without agoraphobia     ETOH Abuse       Electronically Signed   _______________________ Arley Phenix, Psy.D.

## 2017-03-19 ENCOUNTER — Inpatient Hospital Stay (HOSPITAL_COMMUNITY): Payer: Managed Care, Other (non HMO) | Admitting: Occupational Therapy

## 2017-03-19 ENCOUNTER — Inpatient Hospital Stay (HOSPITAL_COMMUNITY): Payer: Managed Care, Other (non HMO) | Admitting: Physical Therapy

## 2017-03-19 ENCOUNTER — Inpatient Hospital Stay (HOSPITAL_COMMUNITY): Payer: Managed Care, Other (non HMO)

## 2017-03-19 NOTE — Progress Notes (Signed)
Occupational Therapy Session Note  Patient Details  Name: Beth Austin MRN: [id number] Date of Birth: [birth year]  Today's Date: 03/19/2017 OT Individual Time: 0800-0900 OT Individual Time Calculation (min): 60 min    Short Term Goals: Week 1:     Skilled Therapeutic Interventions/Progress Updates:    Pt greeted sitting in recliner finishing breakfast and agreeable to OT Treatment. Pt reports already showering and completing ADLs with supervision from partner [name]. BP taken in sitting (102/45) and standing (98/49) no orthostasis or dizziness. Pt ambulated to bathroom without AD and supervision. Pt had successful BM and completed 3/3 toileting steps Mod I. Addressed activity tolerance with functional ambulation to dayroom w/ rollator and no rest breaks until reaching day room. Address dynamic balance with focus on weight shifting w/ hip and ankle strategies using biodex. Pt tolerated 6 mins of standing biodex. Pt took rest break, then worked on B hand fine motor coordination and multitasking skills using keyboard typing test on family room computers. First activity had pt focus on timing and accuracy. Pt's initial typing speed was 17 with 3 errors in 1 minute time frame. Graded activity to pt trying to increase typing speed with speed increased to 27 but errors increased to 12. Pt ambulated back to room at end of session and left seated in recliner with family present and needs met.   Therapy Documentation Precautions:  Precautions Precautions: Sternal, Fall Precaution Comments: able to recall functional things she cannot do but unable to say "no push/lift" Restrictions Weight Bearing Restrictions: Yes RUE Weight Bearing: Non weight bearing LUE Weight Bearing: Non weight bearing Other Position/Activity Restrictions: sternal precautions Pain:  none/denies pain ADL: ADL ADL Comments: see functional navigator  See Function Navigator for Current Functional Status.   Therapy/Group:  Individual Therapy  Valma Cava 03/19/2017, 8:17 AM

## 2017-03-19 NOTE — Plan of Care (Signed)
Problem: RH BOWEL ELIMINATION Goal: RH STG MANAGE BOWEL WITH ASSISTANCE STG Manage Bowel with Mod I Assistance.  Outcome: Not Progressing Patient incontinent this am

## 2017-03-19 NOTE — Progress Notes (Signed)
Subjective/Complaints:  No issues overnite  ROS: pt denies nausea, vomiting, diarrhea, cough, shortness of breath or chest pain    Objective: Vital Signs: Blood pressure 110/61, pulse 72, temperature 98.7 F (37.1 C), temperature source Oral, resp. rate 18, height 5' 10"  (1.778 m), weight 76 kg (167 lb 9.6 oz), SpO2 98 %. No results found. Results for orders placed or performed during the hospital encounter of 03/11/17 (from the past 72 hour(s))  CBC     Status: Abnormal   Collection Time: 03/18/17 12:18 AM  Result Value Ref Range   WBC 7.3 4.0 - 10.5 K/uL   RBC 2.76 (L) 3.87 - 5.11 MIL/uL   Hemoglobin 8.0 (L) 12.0 - 15.0 g/dL   HCT 25.2 (L) 36.0 - 46.0 %   MCV 91.3 78.0 - 100.0 fL   MCH 29.0 26.0 - 34.0 pg   MCHC 31.7 30.0 - 36.0 g/dL   RDW 13.9 11.5 - 15.5 %   Platelets 303 150 - 400 K/uL  Basic metabolic panel     Status: Abnormal   Collection Time: 03/18/17 12:18 AM  Result Value Ref Range   Sodium 136 135 - 145 mmol/L   Potassium 3.9 3.5 - 5.1 mmol/L   Chloride 105 101 - 111 mmol/L   CO2 23 22 - 32 mmol/L   Glucose, Bld 111 (H) 65 - 99 mg/dL   BUN 15 6 - 20 mg/dL   Creatinine, Ser 0.72 0.44 - 1.00 mg/dL   Calcium 8.4 (L) 8.9 - 10.3 mg/dL   GFR calc non Af Amer >60 >60 mL/min   GFR calc Af Amer >60 >60 mL/min    Comment: (NOTE) The eGFR has been calculated using the CKD EPI equation. This calculation has not been validated in all clinical situations. eGFR's persistently <60 mL/min signify possible Chronic Kidney Disease.    Anion gap 8 5 - 15     HEENT: normal Cardio: RRR without murmur. No JVD   Resp: CTA Bilaterally without wheezes or rales. Normal effort  GI: BS positive and Non tender Extremity:  Pulses positive and No Edema Skin:   Intact, sternotomy healing well, dry Neuro: Alert/Oriented, Normal Sensory and Abnormal FMC Ataxic/ dec FMC 4+ BUE and BLE. Good sitting balance Musc/Skel:  Normal Gen NAD   Assessment/Plan: 1. Functional deficits  secondary to L PLIC infarct which require 3+ hours per day of interdisciplinary therapy in a comprehensive inpatient rehab setting. Physiatrist is providing close team supervision and 24 hour management of active medical problems listed below. Physiatrist and rehab team continue to assess barriers to discharge/monitor patient progress toward functional and medical goals. FIM: Function - Bathing Position: Shower Body parts bathed by patient: Right arm, Left arm, Chest, Abdomen, Front perineal area, Buttocks, Right upper leg, Left upper leg, Right lower leg, Left lower leg Body parts bathed by helper: Back Assist Level: Set up, Supervision or verbal cues Set up : To obtain items, To adjust water temperature, To open containers  Function- Upper Body Dressing/Undressing What is the patient wearing?: Pull over shirt/dress Pull over shirt/dress - Perfomed by patient: Thread/unthread right sleeve, Thread/unthread left sleeve, Put head through opening Pull over shirt/dress - Perfomed by helper: Put head through opening, Pull shirt over trunk Assist Level: Supervision or verbal cues, Set up Set up : To obtain clothing/put away Function - Lower Body Dressing/Undressing What is the patient wearing?: Underwear, Pants, Shoes Position: Wheelchair/chair at sink Underwear - Performed by patient: Thread/unthread right underwear leg, Thread/unthread left underwear leg,  Pull underwear up/down Underwear - Performed by helper: Thread/unthread right underwear leg Pants- Performed by patient: Thread/unthread right pants leg, Thread/unthread left pants leg, Pull pants up/down Pants- Performed by helper: Thread/unthread right pants leg Shoes - Performed by patient: Don/doff right shoe, Don/doff left shoe Assist for footwear: Supervision/touching assist Assist for lower body dressing: Supervision or verbal cues, Touching or steadying assistance (Pt > 75%)  Function - Toileting Toileting activity did not occur:  Safety/medical concerns Toileting steps completed by patient: Adjust clothing prior to toileting, Performs perineal hygiene, Adjust clothing after toileting Toileting Assistive Devices: Grab bar or rail Assist level: More than reasonable time  Function - Air cabin crew transfer activity did not occur: Safety/medical concerns Toilet transfer assistive device: Bedside commode Assist level to toilet: No Help, No cues Assist level from toilet: No Help, No cues Assist level to bedside commode (at bedside): No Help, no cues, assistive device, takes more than a reasonable amount of time Assist level from bedside commode (at bedside): No Help, no cues, assistive device, takes more than a reasonable amount of time  Function - Chair/bed transfer Chair/bed transfer activity did not occur: N/A Chair/bed transfer method: Ambulatory Chair/bed transfer assist level: Supervision or verbal cues Chair/bed transfer assistive device:  (heart pillow) Chair/bed transfer details: Verbal cues for precautions/safety  Function - Locomotion: Wheelchair Will patient use wheelchair at discharge?: No Function - Locomotion: Ambulation Ambulation activity did not occur: Safety/medical concerns Assistive device: Walker-rolling Max distance: 162 Assist level: Supervision or verbal cues Assist level: Supervision or verbal cues Walk 50 feet with 2 turns activity did not occur: Safety/medical concerns Assist level: Touching or steadying assistance (Pt > 75%) Walk 150 feet activity did not occur: Safety/medical concerns Assist level: Supervision or verbal cues Walk 10 feet on uneven surfaces activity did not occur: Safety/medical concerns  Function - Comprehension Comprehension: Auditory Comprehension assist level: Follows complex conversation/direction with no assist  Function - Expression Expression: Verbal Expression assist level: Expresses complex ideas: With no assist  Function - Social  Interaction Social Interaction assist level: Interacts appropriately with others - No medications needed.  Function - Problem Solving Problem solving assist level: Solves complex problems: Recognizes & self-corrects  Function - Memory Memory assist level: More than reasonable amount of time Patient normally able to recall (first 3 days only): Current season, Location of own room, Staff names and faces, That he or she is in a hospital  Medical Problem List and Plan: 1.  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 CIR PT, OT, SLP.Team conf in am 2.  DVT Prophylaxis/Anticoagulation: Pharmaceutical: Lovenox- cardiology started Eliquis 3. Pain Management:mainly opper back pain and incisional pain Will continue  ultram prn, warm Kpad for back 4. Mood: LCSW to follow for evaluation and support. 5. Neuropsych: This patient is not fully capable of making decisions on her own behalf. 6. Skin/Wound Care: Monitor wound daily for healing. Maintain adequate hydration and nutritional status 7. Fluids/Electrolytes/Nutrition: Monitor I/O. Check lytes in am. 8. A fib s/p DCCV: Monitor HR bid. Continue amiodarone. Metoprolol resumed today--monitor for orthostatic symptoms.  Reassured pt that she can do therapy today Cardiology on consult, had episode of RVR 9/17- f/u~3wks post d/c  9. Anxiety disorder: Continue Xanax prn. remeron changed to lexapro---with excellent results.   10. Seizure activity: On keppra bid for lifelong basis per neurology.no seizure since admit to rehab 11. H/o Alcohol abuse: Has competed CIWA protocol.Pt states she drank in evenings  to relax, "didn't realize how much I depended on it"  12.  Hypoalb start prostat- encourage PO 13.  ABLA stable  Hgb at 8.0   14.  S/p Bentall procedure, bioprosthetic valve  LOS (Days) 8 A FACE TO FACE EVALUATION WAS PERFORMED  Beth Austin 03/19/2017, 7:45 AM

## 2017-03-19 NOTE — Progress Notes (Signed)
Physical Therapy Session Note  Patient Details  Name: Beth Austin MRN: [id number] Date of Birth: 27-Dec-1945  Today's Date: 03/19/2017 PT Individual Time: 1105-1150 PT Individual Time Calculation (min): 45 min   Short Term Goals: Week 1:  PT Short Term Goal 1 (Week 1): Pt will complete DGI balance assessment PT Short Term Goal 2 (Week 1): Pt will negotiate 12 steps with min assist PT Short Term Goal 3 (Week 1): Pt will recall 3/3 sternal precautions without cues PT Short Term Goal 4 (Week 1): Pt will ambulate 100' with min assist   Skilled Therapeutic Interventions/Progress Updates:   Pt reporting feeling very tired and out of it today from taking xanex and dealing with outside stressors. Session focused on functional transfers and mobility on unit, overall endurance, functional dynamic standing balance, strengthening exercises and gait over uneven surfaces to simulate community mobility. Alternating toe taps to 6" step x 10 reps each side x 2 sets without UE support with overall min assist for balance. Standing mini squats x 10 reps x 2 sets without UE support with focus on eccentric control to assist with transfers and overall mobility. Administered TUG (see results below) to address fall risk. Pt able to gait up/down ramp and over mulched surface without AD with min assist for balance and educated on community mobility.   Therapy Documentation Precautions:  Precautions Precautions: Sternal, Fall Precaution Comments: able to recall functional things she cannot do but unable to say "no push/lift" Restrictions Weight Bearing Restrictions: Yes RUE Weight Bearing: Non weight bearing LUE Weight Bearing: Non weight bearing Other Position/Activity Restrictions: sternal precautions Vital Signs: Therapy Vitals Pulse Rate: 78 BP: (!) 115/49 Pain: Denies pain.    Balance: Standardized Balance Assessment Standardized Balance Assessment: Timed Up and Go Test Timed Up and Go Test TUG:  Normal TUG (19 sec, 26 sec, 23 sec) Normal TUG (seconds): 22.67   See Function Navigator for Current Functional Status.   Therapy/Group: Individual Therapy  Karolee Stamps Darrol Poke, PT, DPT  03/19/2017, 11:44 AM

## 2017-03-19 NOTE — NC FL2 (Signed)
[place] MEDICAID FL2 LEVEL OF CARE SCREENING TOOL     IDENTIFICATION  Patient Name: Beth Austin Birthdate: [birth year] Sex: female Admission Date (Current Location): 03/11/2017  [facility] and [place] Number:  Producer, television/film/video and Address:  [facility], [address] [place], [place] [address]      Provider Number: [id number]  Attending Physician Name and Address:  Erick Colace, MD  Relative Name and Phone Number:       Current Level of Care: Domiciliary (Rest home) Recommended Level of Care: Assisted Living Facility Prior Approval Number:    Date Approved/Denied:   PASRR Number: [id number]  Discharge Plan: Domiciliary (Rest home)    Current Diagnoses: Patient Active Problem List   Diagnosis Date Noted  . Panic disorder   . 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 Seizures (HCC)   . ETOH abuse   . Anxiety state   . Atrial fibrillation (HCC)     Orientation RESPIRATION BLADDER Height & Weight     Self, Time, Situation, Place  Normal Continent Weight: 76 kg (167 lb 9.6 oz) Height:   (177.8 cm)  BEHAVIORAL SYMPTOMS/MOOD NEUROLOGICAL BOWEL NUTRITION STATUS    Convulsions/Seizures (on Keppra BID for lifetime) Continent Diet (Heart healthy diet)  AMBULATORY STATUS COMMUNICATION OF NEEDS Skin   Supervision Verbally Surgical wounds (steri strips to sternal incision and incision on R upper chest)                       Personal Care Assistance Level of Assistance  Bathing, Dressing Bathing Assistance: Limited assistance (supervision)   Dressing Assistance: Limited assistance (supervision)     Functional Limitations Info             SPECIAL CARE FACTORS FREQUENCY   (Pt to go to outpatient PT/OT.  )                    Contractures Contractures Info: Not present    Additional Factors Info  Code Status, Allergies Code Status Info: full Allergies Info: Latex, Remeron           Current Medications  (03/19/2017):  This is the current hospital active medication list Current Facility-Administered Medications  Medication Dose Route Frequency Provider Last Rate Last Dose  . acetaminophen (TYLENOL) tablet 325-650 mg  325-650 mg Oral Q4H PRN Jacquelynn Cree, PA-C   650 mg at 03/12/17 1648  . ALPRAZolam Prudy Feeler) tablet 0.125 mg  0.125 mg Oral TID PRN Jacquelynn Cree, PA-C   0.125 mg at 03/19/17 0804  . alum & mag hydroxide-simeth (MAALOX/MYLANTA) 200-200-20 MG/5ML suspension 30 mL  30 mL Oral Q4H PRN Love, Pamela S, PA-C      . amiodarone (PACERONE) tablet 200 mg  200 mg Oral Daily Jacquelynn Cree, PA-C   200 mg at 03/19/17 0755  . apixaban (ELIQUIS) tablet 5 mg  5 mg Oral BID Chrystie Nose, MD   5 mg at 03/19/17 0755  . atorvastatin (LIPITOR) tablet 40 mg  40 mg Oral q1800 Jacquelynn Cree, PA-C   40 mg at 03/18/17 1802  . bisacodyl (DULCOLAX) suppository 10 mg  10 mg Rectal Daily PRN Love, Pamela S, PA-C      . diphenhydrAMINE (BENADRYL) 12.5 MG/5ML elixir 12.5-25 mg  12.5-25 mg Oral Q6H PRN Love, Pamela S, PA-C      . escitalopram (LEXAPRO) tablet 5 mg  5 mg Oral QHS Kirsteins, Victorino Sparrow, MD  5 mg at 03/18/17 2103  . feeding supplement (PRO-STAT SUGAR FREE 64) liquid 30 mL  30 mL Oral BID Erick Colace, MD   30 mL at 03/18/17 2103  . guaiFENesin-dextromethorphan (ROBITUSSIN DM) 100-10 MG/5ML syrup 5-10 mL  5-10 mL Oral Q6H PRN Love, Pamela S, PA-C      . hydrocortisone cream 0.5 %   Topical BID Kirsteins, Victorino Sparrow, MD      . ibuprofen (ADVIL,MOTRIN) tablet 400 mg  400 mg Oral Q6H PRN Love, Pamela S, PA-C      . levETIRAcetam (KEPPRA) tablet 1,000 mg  1,000 mg Oral BID Love, Pamela S, PA-C   1,000 mg at 03/19/17 0755  . metoprolol tartrate (LOPRESSOR) tablet 12.5 mg  12.5 mg Oral BID Delle Reining S, PA-C   12.5 mg at 03/19/17 0755  . pantoprazole (PROTONIX) EC tablet 20 mg  20 mg Oral Daily Jacquelynn Cree, PA-C   20 mg at 03/19/17 0755  . polyethylene glycol (MIRALAX / GLYCOLAX) packet 17 g  17  g Oral Daily PRN Love, Pamela S, PA-C      . prochlorperazine (COMPAZINE) tablet 5-10 mg  5-10 mg Oral Q6H PRN Love, Pamela S, PA-C       Or  . prochlorperazine (COMPAZINE) injection 5-10 mg  5-10 mg Intramuscular Q6H PRN Love, Pamela S, PA-C       Or  . prochlorperazine (COMPAZINE) suppository 12.5 mg  12.5 mg Rectal Q6H PRN Love, Pamela S, PA-C      . senna-docusate (Senokot-S) tablet 1 tablet  1 tablet Oral QHS PRN Love, Pamela S, PA-C      . sodium phosphate (FLEET) 7-19 GM/118ML enema 1 enema  1 enema Rectal Once PRN Love, Pamela S, PA-C      . traMADol Janean Sark) tablet 50 mg  50 mg Oral Q6H PRN Love, Pamela S, PA-C   50 mg at 03/14/17 1610     Discharge Medications: Please see discharge summary for a list of discharge medications.  Relevant Imaging Results:  Relevant Lab Results:   Additional Information SSN: [id number]  Elizzie Westergard, [place], LCSW

## 2017-03-19 NOTE — Plan of Care (Signed)
Problem: RH BOWEL ELIMINATION Goal: RH STG MANAGE BOWEL WITH ASSISTANCE STG Manage Bowel with Mod I Assistance.  Outcome: Progressing  03/19/17 0453  Bowel Management Goals  STG: Pt will manage bowels with assistance 7-Independent   Goal: RH STG MANAGE BOWEL W/MEDICATION W/ASSISTANCE STG Manage Bowel with Medication with mod I Assistance.  Outcome: Progressing  03/19/17 0453  Bowel Management Goals  STG: Pt will manage bowels with medication with assistance 7-Independent    Problem: RH SAFETY Goal: RH STG ADHERE TO SAFETY PRECAUTIONS W/ASSISTANCE/DEVICE STG Adhere to Safety Precautions With mod I Assistance/Device.  Outcome: Progressing  03/19/17 0453  Safety Goals  STG:Pt will adhere to safety precautions with assistance/device 7-Independent   Goal: RH STG DECREASED RISK OF FALL WITH ASSISTANCE STG Decreased Risk of Fall With mod I Assistance.  Outcome: Progressing  03/19/17 0453  Safety Goals  [id number] risk of fall with assistance/device 7-Independent

## 2017-03-19 NOTE — Progress Notes (Addendum)
Initial Nutrition Assessment  DOCUMENTATION CODES:   Not applicable  INTERVENTION:    Continue Prostat liquid protein po 30 ml BID with meals, each supplement provides 100 kcal, 15 grams protein  NUTRITION DIAGNOSIS:   Increased nutrient needs related to  (rehabilitation) as evidenced by estimated needs  GOAL:   Patient will meet greater than or equal to 90% of their needs  MONITOR:   PO intake, Supplement acceptance, Labs, Weight trends, Skin, I & O's  REASON FOR ASSESSMENT:   Malnutrition Screening Tool  ASSESSMENT:   71 y.o. Female emergently admitted to [facility] 02/26/2017 with seizure, pain radiating from chest to back and RLE due to aortic dissection. 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 MRI 9/13 showed acute/subacute infarct.Transferred to CIR on 03/11/2017.  Pt not in room at time of visit. In therapy session. Per Malnutrition Screening Tool Report, pt with a decreased appetite. Also with recent wt loss without trying. Per readings below, pt with 11% wt loss since admission. ? accuracy.   PO intake variable at 40-60% per flowsheet records. Labs and medications reviewed. Receiving Prostat BID. Unable to complete Nutrition-Focused physical exam at this time.   Diet Order:  Diet Heart Room service appropriate? Yes; Fluid consistency: Thin  Skin:  Reviewed, no issues  Last BM:  9/25  Height:   Ht Readings from Last 1 Encounters:  03/11/17  (1.778 m)   Weight:   Wt Readings from Last 1 Encounters:  03/19/17 167 lb 9.6 oz (76 kg)   Wt Readings from Last 10 Encounters:  03/19/17 167 lb 9.6 oz (76 kg)  03/10/17 188 lb 4.4 oz (85.4 kg)   Ideal Body Weight:  68.1 kg  BMI:  Body mass index is 24.05 kg/m.  Estimated Nutritional Needs:   Kcal:  1800-2000  Protein:  90-110 gm  Fluid:  1.8-2.0 L  EDUCATION NEEDS:   No education needs identified at this time  Maureen Chatters, RD, LDN Pager #:  [phone] After-Hours Pager #: [phone]

## 2017-03-19 NOTE — Progress Notes (Signed)
Physical Therapy Session Note  Patient Details  Name: Beth Austin MRN: [id number] Date of Birth: [birth year]  Today's Date: 03/19/2017 PT Individual Time: 4098-1191, 4782-9562 PT Individual Time Calculation (min): 51 min, 45 min   Short Term Goals: Week 1:  PT Short Term Goal 1 (Week 1): Pt will complete DGI balance assessment PT Short Term Goal 2 (Week 1): Pt will negotiate 12 steps with min assist PT Short Term Goal 3 (Week 1): Pt will recall 3/3 sternal precautions without cues PT Short Term Goal 4 (Week 1): Pt will ambulate 100' with min assist   Skilled Therapeutic Interventions/Progress Updates:    Session 1: no c/o pain throughout session; pt reports feeling groggy after taking a xanax at 8 am. Session focused on functional mobility and standing activity tolerance.   Pt transferred throughout session with supervision and rollator when performing sit > stands and transferring to bed. Pt ambulated 300' from patient room to gym with supervision and rollator, requiring 2 rest breaks at the beginning of therapy. Pt performed Dynavision while standing on level surface x 2 min on Mode A in lower quadrants to maintain sternal precautions, hit 72 targets with 1.67 average reaction time. Task focused on standing tolerance as well as balance, weight shifting, scanning, and reaction time. Pt performed standing on airex pad x 1 min with table edge nearby for balance, began puzzle but required rest break after 1 minute and expressed no desire to continue puzzle. Pt performed dynavision standing on airex pad x 2 on Mode A in lower 2 quadrants for 60 sec, 30 targets on trial one and 36 targets on trial 2, working on standing tolerance and standing balance on uneven surface. Pt required frequent rest breaks between all activities. Pt ambulated 300' back to patient room and required no rest breaks and demonstrated increased gait speed. Pt returned to bed in supine with call bell and all needs addressed.    Session 2: no c/o pain throughout session; session focused on activity tolerance.   Pt performed sit > stand with supervision and rollator throughout session. Pt ambulated 300' from patient room to therapy gym with supervision and rollator, requiring 1 rest break on the way to the gym. Pt performed NuStep x 10 min to work on activity tolerance and endurance. Pt ambulated 300' back to patient room from the gym with 1 rest break due to feeling a localized pain in lower, right chest area. Pt reported pain relieving and continued ambulation back to room. Pt requested to use restroom prior to getting back in bed and used the restroom and performed hand hygiene with supervision. Pt performed sit > supine with supervision. Pt left supine in bed with call bell and all needs addressed.   Therapy Documentation Precautions:  Precautions Precautions: Sternal, Fall Precaution Comments: able to recall functional things she cannot do but unable to say "no push/lift" Restrictions Weight Bearing Restrictions: Yes RUE Weight Bearing: Non weight bearing LUE Weight Bearing: Non weight bearing Other Position/Activity Restrictions: sternal precautions   See Function Navigator for Current Functional Status.   Therapy/Group: Individual Therapy  Beth Austin 03/19/2017, 9:57 AM

## 2017-03-20 ENCOUNTER — Inpatient Hospital Stay (HOSPITAL_COMMUNITY): Payer: Managed Care, Other (non HMO) | Admitting: Occupational Therapy

## 2017-03-20 ENCOUNTER — Inpatient Hospital Stay (HOSPITAL_COMMUNITY): Payer: Managed Care, Other (non HMO) | Admitting: Physical Therapy

## 2017-03-20 MED ORDER — ALPRAZOLAM 0.25 MG PO TABS
0.1250 mg | ORAL_TABLET | Freq: Every evening | ORAL | Status: AC | PRN
  Administered 2017-03-20: 0.125 mg via ORAL
  Filled 2017-03-20: qty 1

## 2017-03-20 MED ORDER — SENNA-DOCUSATE SODIUM 8.6-50 MG PO TABS: 1.0000 | ORAL_TABLET | Freq: Every day | ORAL | Status: AC | PRN

## 2017-03-20 NOTE — Progress Notes (Signed)
Physical Therapy Discharge Summary  Patient Details  Name: Beth Austin MRN: [id number] Date of Birth: 07-19-1945  Today's Date: 03/20/2017 PT Individual Time: 0900-1000, 1445-1515 PT Individual Time Calculation (min): 60 min, 30 min    Patient has met 10 of 10 long term goals due to improved activity tolerance, improved balance, improved postural control, improved awareness and improved coordination.  Patient to discharge at an ambulatory level Modified Independent.   Patient's care partner is independent to provide the necessary physical assistance at discharge.  Reasons goals not met: N/A  Recommendation:  Patient will benefit from ongoing skilled PT services in outpatient setting to continue to advance safe functional mobility, address ongoing impairments in activity tolerance and balance, and minimize fall risk.  Equipment: Wheelchair, rollator  Reasons for discharge: treatment goals met  Patient/family agrees with progress made and goals achieved: Yes   Skilled PT Intervention:  Session 1: No c/o pain throughout session; stated had BP drop when working with OT and felt very weak. Session focused on functional mobility and functional activities related to patient discharge.   Pt found supine in bed, resting, due to having low BP with OT therapy. PT checked BP and HR while pt supine, 91/51 and 70 bpm. Pt performed transfers mod I throughout session. Pt performed car transfers into and out of the car mod I. Pt ambulated up ramp, across mulch, and down curb mod I using one railing and increased time. Pt ambulated up one curb, across mulch, and down ramp mod I also using one railing and increased time. Pt amb throughout unit with rollator mod I, max distance 150'. Pt climbed up and down 12 stairs mod I using railings and increased time. Pt returned to room, supine in bed, call bell within reach and all needs addressed.   Session 2:  No c/o pain throughout session; stated she was  feeling some better. Session focused on home exercise program review and practice.   Pt performed sit > stand mod I and transfers mod I with rollator throughout session. Pt reviewed home exercise program [place], including knee bends x 10 x 2 sets, walking backwards 25' x 2 with railing, walking and turning/"figure 8" with rollator x 2, sideways walking 25' x 2 with railing, tandem stance x 10 sec each leg in front, single leg stance x 10 sec each leg, sit > stand with no hands x 10. Pt felt good about home exercises and verbalized and demonstrated understanding. Pt left supine in bed with call bell and all needs addressed.   PT Discharge Precautions/Restrictions Precautions Precautions: Sternal;Fall Pain Pain Assessment Pain Assessment: No/denies pain Pain Score: 0-No pain Vision/Perception  Perception Perception: Within Functional Limits Praxis Praxis: Intact  Cognition Overall Cognitive Status: Within Functional Limits for tasks assessed Arousal/Alertness: Awake/alert Orientation Level: Oriented X4 Attention: Selective Sustained Attention: Appears intact Selective Attention: Appears intact Memory: Appears intact Sensation Sensation Light Touch: Appears Intact (LEs) Coordination Gross Motor Movements are Fluid and Coordinated: Yes Fine Motor Movements are Fluid and Coordinated: Yes Motor  Motor Motor: Within Functional Limits Motor - Skilled Clinical Observations: ongoing decrease in activity tolerance but improving daily  Mobility Bed Mobility Bed Mobility: Supine to Sit Supine to Sit: 6: Modified independent (Device/Increase time) Transfers Transfers: Yes Sit to Stand: 6: Modified independent (Device/Increase time) Stand to Sit: 6: Modified independent (Device/Increase time) Locomotion  Ambulation Ambulation: Yes Ambulation/Gait Assistance: 6: Modified independent (Device/Increase time) Ambulation Distance (Feet): 150 Feet Assistive device: Rolling walker  (rollator) Stairs /  Additional Locomotion Stairs: Yes Stairs Assistance: 6: Modified independent (Device/Increase time) Stair Management Technique: Two rails;Alternating pattern Number of Stairs: 12 Ramp: 6: Modified independent (Device) Curb: 6: Modified independent (Device/increase time) Wheelchair Mobility Wheelchair Mobility: No  Trunk/Postural Assessment  Cervical Assessment Cervical Assessment: Within Functional Limits Thoracic Assessment Thoracic Assessment: Within Functional Limits Lumbar Assessment Lumbar Assessment: Within Functional Limits Postural Control Postural Control: Within Functional Limits  Balance Balance Balance Assessed: Yes Static Standing Balance Static Standing - Balance Support: During functional activity Static Standing - Level of Assistance: 6: Modified independent (Device/Increase time) Dynamic Standing Balance Dynamic Standing - Balance Support: No upper extremity supported;During functional activity Dynamic Standing - Level of Assistance: 6: Modified independent (Device/Increase time) Extremity Assessment      RLE Assessment RLE Assessment: Within Functional Limits (5/5 throughout) LLE Assessment LLE Assessment: Within Functional Limits (5/5 throughout)   See Function Navigator for Current Functional Status.  Beth Austin 03/20/2017, 9:56 AM

## 2017-03-20 NOTE — Progress Notes (Signed)
Occupational Therapy Discharge Summary  Patient Details  Name: Beth Austin MRN: [id number] Date of Birth: 1946-06-07  Patient has met 12 of 12 long term goals due to improved activity tolerance, improved balance, postural control, ability to compensate for deficits, functional use of  RIGHT upper extremity, improved attention, improved awareness and improved coordination.  Patient to discharge at overall Modified Independent level.  Patient's care partner is independent to provide the necessary physical assistance at discharge.    Reasons goals not met: n/a   Recommendation:  Patient will benefit from ongoing skilled OT services in outpatient setting to continue to advance functional skills in the area of BADL.  Equipment: wheelchair (see PT note for sizing), tub transfer bench, rollator walker  Reasons for discharge: treatment goals met and discharge from hospital  Patient/family agrees with progress made and goals achieved: Yes  OT Discharge Precautions/Restrictions  Precautions Precautions: Sternal;Fall Precaution Comments: able to recall functional things she cannot do but unable to say "no push/lift" Restrictions Weight Bearing Restrictions: Yes RUE Weight Bearing: Non weight bearing LUE Weight Bearing: Non weight bearing Other Position/Activity Restrictions: sternal precautions Pain Pain Assessment Pain Assessment: No/denies pain Pain Score: 0-No pain ADL ADL Eating: Independent Grooming: Independent Upper Body Bathing: Independent Where Assessed-Upper Body Bathing: Shower Lower Body Bathing: Modified independent Where Assessed-Lower Body Bathing: Shower Upper Body Dressing: Independent Where Assessed-Upper Body Dressing: Chair Lower Body Dressing: Modified independent Where Assessed-Lower Body Dressing: Chair Toileting: Modified independent Where Assessed-Toileting: Glass blower/designer: Modified Programmer, applications Method: Printmaker: Modified independent Tub/Shower Transfer Method: Optometrist: Radio broadcast assistant ADL Comments: see functional navigator Vision Baseline Vision/History: Wears glasses Wears Glasses: At all times Patient Visual Report: Other (comment) (believes she sees something in her periphery but when she looks towards it, it is not really there) Vision Assessment?: No apparent visual deficits Perception  Perception: Within Functional Limits Praxis Praxis: Intact Cognition Overall Cognitive Status: Within Functional Limits for tasks assessed Arousal/Alertness: Awake/alert Orientation Level: Oriented X4 Attention: Selective Sustained Attention: Appears intact Selective Attention: Appears intact Memory: Appears intact Sensation Sensation Light Touch: Appears Intact (LEs) Stereognosis: Appears Intact Hot/Cold: Appears Intact Coordination Gross Motor Movements are Fluid and Coordinated: Yes Fine Motor Movements are Fluid and Coordinated: Yes Finger Nose Finger Test: improved R hand coordination Motor  Motor Motor: Within Functional Limits Motor - Skilled Clinical Observations: ongoing decrease in activity tolerance but improving daily Motor - Discharge Observations: improved activity tolerance, but Mobility  Bed Mobility Bed Mobility: Supine to Sit Supine to Sit: 6: Modified independent (Device/Increase time) Transfers Sit to Stand: 6: Modified independent (Device/Increase time) Stand to Sit: 6: Modified independent (Device/Increase time)  Trunk/Postural Assessment  Cervical Assessment Cervical Assessment: Within Functional Limits Thoracic Assessment Thoracic Assessment: Within Functional Limits Lumbar Assessment Lumbar Assessment: Within Functional Limits Postural Control Postural Control: Within Functional Limits  Balance Balance Balance Assessed: Yes Dynamic Sitting Balance Dynamic Sitting - Balance Support: During functional activity Dynamic  Sitting - Level of Assistance: 6: Modified independent (Device/Increase time) Static Standing Balance Static Standing - Balance Support: During functional activity Static Standing - Level of Assistance: 6: Modified independent (Device/Increase time) Dynamic Standing Balance Dynamic Standing - Balance Support: No upper extremity supported;During functional activity Dynamic Standing - Level of Assistance: 6: Modified independent (Device/Increase time) Extremity/Trunk Assessment RUE Assessment RUE Assessment: Within Functional Limits RUE Strength RUE Overall Strength Comments: improved fine motor coordination 4-/5 overall  RUE Tone RUE Tone: Within Functional Limits;Modified Ashworth Modified Ashworth Scale for  Grading Hypertonia RUE: No increase in muscle tone LUE Assessment LUE Assessment: Within Functional Limits   See Function Navigator for Current Functional Status.  Beth Austin Beth Austin 03/20/2017, 3:44 PM

## 2017-03-20 NOTE — Patient Care Conference (Signed)
Inpatient RehabilitationTeam Conference and Plan of Care Update Date: 03/20/2017   Time: 11:00 AM    Patient Name: Beth Austin      Medical Record Number: [id number]  Date of Birth: [birth year] Sex: Female         Room/Bed: 4M06C/4M06C-01 Payor Info: Payor: CIG[employer] / Plan: CIGNA MANAGED / Product Type: *No Product type* /    Admitting Diagnosis: CVA  Admit Date/Time:  03/11/2017  4:18 PM Admission Comments: No comment available   Primary Diagnosis:  <principal problem not specified> Principal Problem: <principal problem not specified>  Patient Active Problem List   Diagnosis Date Noted  . Panic disorder   . 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 Seizures ([place])   . ETOH abuse   . Anxiety state   . Atrial fibrillation [facility])     Expected Discharge Date: Expected Discharge Date: 03/21/17  Team Members Present: Physician leading conference: Dr. Alysia Penna Social Worker Present: Alfonse Alpers, LCSW Nurse Present: Leonette Nutting, RN PT Present: Dwyane Dee, PT OT Present: Cherylynn Ridges, OT SLP Present: Weston Anna, SLP PPS Coordinator present : Daiva Nakayama, RN, CRRN     Current Status/Progress Goal Weekly Team Focus  Medical   Patient has improved anxiety on Lexapro, had excess sedation from Xanax. This has been discontinued  Manage medications for anxiety, minimize medications that are sedating  Discharge planning   Bowel/Bladder   Continent of bowel and bladder; LBM 9/25- had one accident during night on 9/24  Mod I assist  Assess and treat for constipation as needed   Swallow/Nutrition/ Hydration   Encouraging PO intake         ADL's   Mod I/supervision  Mod I/supervision  dc planning, activity tolerance    Mobility   mod I>surpassed goals of supervision  supervision overall  d/c Thurs   Communication             Safety/Cognition/ Behavioral Observations            Pain   C/o intermittent soreness through chest; not requesting any PRN pain meds  <  3  Assess and treat for pain q shift and prn   Skin   sternal incision CDI  No skin breakdown or infection with mod I assist  Assess skin q shift and prn    Rehab Goals Patient on target to meet rehab goals: Yes Rehab Goals Revised: none *See Care Plan and progress notes for long and short-term goals.     Barriers to Discharge  Current Status/Progress Possible Resolutions Date Resolved   Physician    Inaccessible home environment     Progressing towards discharge in a.m.  Patient will be staying with family member in [place]                Discharge Planning/Teaching Needs:  Pt plans to go to her niece's home to recuperate until she goes for her f/u appointments with cardiac surgeon and cardiologist at [facility].  [name] to take pt to outpt rehab.  [name] is here daily  and will be open to family education as needed.  Pt's dtr and son are also taking turns being here with pt.   Team Discussion:  Pt with no new medical issues.  She will f/u with cardiac surgery in early October and then cardiologist in three weeks.  She's doing better on Lexapro and is off Xanax and Remeron.  She reports feeling better.  Pt's BP has been low after medication and therapists feel she could do more if it weren't as low.  Dr. Letta Pate is watching this.  Pt has met mod I goals and her activity tolerance is better.  She has no nursing issues.  Revisions to Treatment Plan:  none    Continued Need for Acute Rehabilitation Level of Care: The patient requires daily medical management by a physician with specialized training in physical medicine and rehabilitation for the following conditions: Daily direction of a multidisciplinary physical rehabilitation program to ensure safe treatment while eliciting the highest outcome that is of practical value to the patient.: Yes Daily medical management of patient stability for  increased activity during participation in an intensive rehabilitation regime.: Yes Daily analysis of laboratory values and/or radiology reports with any subsequent need for medication adjustment of medical intervention for : Neurological problems;Post surgical problems  Dino Borntreger, Silvestre Mesi 03/20/2017, 12:35 PM

## 2017-03-20 NOTE — Progress Notes (Signed)
Subjective/Complaints:  No issues , happy about d/c in am, less groggy off Xanax  ROS: pt denies nausea, vomiting, diarrhea, cough, shortness of breath or chest pain    Objective: Vital Signs: Blood pressure (!) 114/51, pulse 73, temperature 98 F (36.7 C), temperature source Oral, resp. rate 18, height 5' 10"  (1.778 m), weight 76.1 kg (167 lb 11.2 oz), SpO2 98 %. No results found. Results for orders placed or performed during the hospital encounter of 03/11/17 (from the past 72 hour(s))  CBC     Status: Abnormal   Collection Time: 03/18/17 12:18 AM  Result Value Ref Range   WBC 7.3 4.0 - 10.5 K/uL   RBC 2.76 (L) 3.87 - 5.11 MIL/uL   Hemoglobin 8.0 (L) 12.0 - 15.0 g/dL   HCT 25.2 (L) 36.0 - 46.0 %   MCV 91.3 78.0 - 100.0 fL   MCH 29.0 26.0 - 34.0 pg   MCHC 31.7 30.0 - 36.0 g/dL   RDW 13.9 11.5 - 15.5 %   Platelets 303 150 - 400 K/uL  Basic metabolic panel     Status: Abnormal   Collection Time: 03/18/17 12:18 AM  Result Value Ref Range   Sodium 136 135 - 145 mmol/L   Potassium 3.9 3.5 - 5.1 mmol/L   Chloride 105 101 - 111 mmol/L   CO2 23 22 - 32 mmol/L   Glucose, Bld 111 (H) 65 - 99 mg/dL   BUN 15 6 - 20 mg/dL   Creatinine, Ser 0.72 0.44 - 1.00 mg/dL   Calcium 8.4 (L) 8.9 - 10.3 mg/dL   GFR calc non Af Amer >60 >60 mL/min   GFR calc Af Amer >60 >60 mL/min    Comment: (NOTE) The eGFR has been calculated using the CKD EPI equation. This calculation has not been validated in all clinical situations. eGFR's persistently <60 mL/min signify possible Chronic Kidney Disease.    Anion gap 8 5 - 15     HEENT: normal Cardio: RRR without murmur. No JVD   Resp: CTA Bilaterally without wheezes or rales. Normal effort  GI: BS positive and Non tender Extremity:  Pulses positive and No Edema Skin:   Intact, sternotomy healing well, dry Neuro: Alert/Oriented, Normal Sensory and Abnormal FMC Ataxic/ dec FMC 4+ BUE and BLE. Good sitting balance Musc/Skel:  Normal Gen  NAD   Assessment/Plan: 1. 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 which require 3+ hours per day of interdisciplinary therapy in a comprehensive inpatient rehab setting. Physiatrist is providing close team supervision and 24 hour management of active medical problems listed below. Physiatrist and rehab team continue to assess barriers to discharge/monitor patient progress toward functional and medical goals. FIM: Function - Bathing Position: Shower Body parts bathed by patient: Right arm, Left arm, Chest, Abdomen, Front perineal area, Buttocks, Right upper leg, Left upper leg, Right lower leg, Left lower leg Body parts bathed by helper: Back Assist Level: Set up, More than reasonable time Set up : To obtain items, To adjust water temperature, To open containers  Function- Upper Body Dressing/Undressing What is the patient wearing?: Pull over shirt/dress Pull over shirt/dress - Perfomed by patient: Thread/unthread left sleeve, Pull shirt over trunk, Put head through opening, Thread/unthread right sleeve Pull over shirt/dress - Perfomed by helper: Put head through opening, Pull shirt over trunk Assist Level: Supervision or verbal cues, Set up Set up : To obtain clothing/put away Function - Lower Body Dressing/Undressing What is the patient wearing?: Underwear, Pants, Shoes Position: Wheelchair/chair at  sink Underwear - Performed by patient: Thread/unthread right underwear leg, Thread/unthread left underwear leg, Pull underwear up/down Underwear - Performed by helper: Thread/unthread right underwear leg Pants- Performed by patient: Thread/unthread right pants leg, Thread/unthread left pants leg, Pull pants up/down Pants- Performed by helper: Thread/unthread right pants leg Shoes - Performed by patient: Don/doff right shoe, Don/doff left shoe Assist for footwear: Supervision/touching assist Assist for lower body dressing: Supervision or verbal cues, Touching or steadying assistance  (Pt > 75%)  Function - Toileting Toileting activity did not occur: Safety/medical concerns Toileting steps completed by patient: Adjust clothing prior to toileting, Performs perineal hygiene, Adjust clothing after toileting Toileting Assistive Devices: Grab bar or rail Assist level: No help/no cues  Function Midwife transfer activity did not occur: Safety/medical concerns Toilet transfer assistive device: Bedside commode Assist level to toilet: No Help, No cues Assist level from toilet: No Help, No cues Assist level to bedside commode (at bedside): No Help, no cues, assistive device, takes more than a reasonable amount of time Assist level from bedside commode (at bedside): No Help, no cues, assistive device, takes more than a reasonable amount of time  Function - Chair/bed transfer Chair/bed transfer activity did not occur: N/A Chair/bed transfer method: Ambulatory Chair/bed transfer assist level: Touching or steadying assistance (Pt > 75%) Chair/bed transfer assistive device: Walker Chair/bed transfer details: Verbal cues for precautions/safety  Function - Locomotion: Wheelchair Will patient use wheelchair at discharge?: No Function - Locomotion: Ambulation Ambulation activity did not occur: Safety/medical concerns Assistive device: No device Max distance: 50' Assist level: Touching or steadying assistance (Pt > 75%) Assist level: Touching or steadying assistance (Pt > 75%) Walk 50 feet with 2 turns activity did not occur: Safety/medical concerns Assist level: Touching or steadying assistance (Pt > 75%) Walk 150 feet activity did not occur: Safety/medical concerns Assist level: Supervision or verbal cues Walk 10 feet on uneven surfaces activity did not occur: Safety/medical concerns Assist level: Touching or steadying assistance (Pt > 75%)  Function - Comprehension Comprehension: Auditory Comprehension assist level: Follows complex conversation/direction  with no assist  Function - Expression Expression: Verbal Expression assist level: Expresses complex ideas: With no assist  Function - Social Interaction Social Interaction assist level: Interacts appropriately with others - No medications needed.  Function - Problem Solving Problem solving assist level: Solves complex problems: Recognizes & self-corrects  Function - Memory Memory assist level: More than reasonable amount of time Patient normally able to recall (first 3 days only): Current season, Location of own room, Staff names and faces, That he or she is in a hospital  Medical Problem List and Plan: 1.  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 CIR PT, OT, SLP.Team conference today please see physician documentation under team conference tab, met with team face-to-face to discuss problems,progress, and goals. Formulized individual treatment plan based on medical history, underlying problem and comorbidities. 2.  DVT Prophylaxis/Anticoagulation: Pharmaceutical: Lovenox- cardiology started Eliquis- will see pt in office about 3 wks 3. Pain Management:mainly opper back pain and incisional pain Will continue  ultram prn, warm Kpad for back 4. Mood: LCSW to follow for evaluation and support. 5. Neuropsych: This patient is not fully capable of making decisions on her own behalf. 6. Skin/Wound Care: Monitor wound daily for healing. Maintain adequate hydration and nutritional status 7. Fluids/Electrolytes/Nutrition: Monitor I/O. Check lytes in am. 8. A fib s/p DCCV: Monitor HR bid. Continue amiodarone. Metoprolol resumed today--monitor for  orthostatic symptoms.  Reassured pt that she can do therapy today Cardiology on consult, had episode of RVR 9/17- f/u~3wks post d/c  9. Anxiety disorder: D/C Xanax cont lexapro---  10. Seizure activity: On keppra bid for lifelong basis per neurology.no seizure since admit to  rehab 11. H/o Alcohol abuse: Has competed CIWA protocol.Pt states she drank in evenings to relax, "didn't realize how much I depended on it"  12.  Hypoalb start prostat- encourage PO 13.  ABLA stable  Hgb at 8.0   14.  S/p Bentall procedure, bioprosthetic valve  LOS (Days) 9 A FACE TO FACE EVALUATION WAS PERFORMED  KIRSTEINS,ANDREW E 03/20/2017, 7:52 AM

## 2017-03-20 NOTE — Progress Notes (Signed)
Occupational Therapy Session Note  Patient Details  Name: Beth Austin MRN: [id number] Date of Birth: January 20, 1946  Today's Date: 03/20/2017  Session 1 OT Individual Time: 6433-2951 OT Individual Time Calculation (min): 55 min   Session 2 OT Individual Time: 8841-6606 OT Individual Time Calculation (min): 54 min    Skilled Therapeutic Interventions/Progress Updates:    Session 1 OT treatment session focused on increased independence with BADL tasks. Pt already had clothing picked out and was seated EOB upon OT arrival. Pt ambulated in room to bathroom, voided bowel and bladder and completed 3/3 toileting steps. Pt then gathered supplies for shower and completed bathing without assistance. Addressed activity tolerance and standing endurance to maintain standing to complete grooming tasks of toothrbushing and hairdrying. Pt needed to sit after ~6 minutes, but continued drying hair. Pt able to maintain sternal precautions during hairdrying. Pt began walking out of door with rollator and needed to sit down stating she felt dizzy. BP 83/49. Pt requested to lay down for short time to await next therapy. Pt left semi-reclined in bed with needs met.  Session 2 Treatment session focused on dc planning, community reintegration, and activity tolerance. OT discussed plans for dc including set-up of niece's home where she will dc to. Discussed energy conservation techniques during daily tasks and home modifications. Pt then ambulated to bathroom and completed tub bench transfer without assistance. Pt then ambulated to [employer]	[place] with rollater and 1 seated rest break at elevators. OT discussed community safety awareness, elevator access, and energy conservation within community context. Pt demonstrated understanding, ambulating onto elevator and appropriately locking rollator before sitting. Pt ambulated on graded surfaces outside and took extended rest break before returning. OT discussed ways to increase  activity, but also stressed the importance of listening to her body for when she needs to rest. Pt left seated in recliner at end of session with partner present and needs met.  Therapy Documentation Precautions:  Precautions Precautions: Sternal, Fall Precaution Comments: able to recall functional things she cannot do but unable to say "no push/lift" Restrictions Weight Bearing Restrictions: Yes RUE Weight Bearing: Non weight bearing LUE Weight Bearing: Non weight bearing Other Position/Activity Restrictions: sternal precautions    See Function Navigator for Current Functional Status.   Therapy/Group: Individual Therapy  Valma Cava 03/20/2017, 12:31 PM

## 2017-03-21 DIAGNOSIS — I7103 Dissection of thoracoabdominal aorta: Secondary | ICD-10-CM

## 2017-03-21 DIAGNOSIS — I63139 Cerebral infarction due to embolism of unspecified carotid artery: Secondary | ICD-10-CM

## 2017-03-21 DIAGNOSIS — D649 Anemia, unspecified: Secondary | ICD-10-CM

## 2017-03-21 MED ORDER — AMIODARONE HCL 200 MG PO TABS: 200.0000 mg | ORAL_TABLET | Freq: Every day | ORAL | 0 refills | Status: AC

## 2017-03-21 MED ORDER — METOPROLOL TARTRATE 12.5 MG HALF TABLET: 6.2500 mg | ORAL_TABLET | Freq: Two times a day (BID) | ORAL | Status: DC

## 2017-03-21 MED ORDER — LEVETIRACETAM 1000 MG PO TABS: 1000.0000 mg | ORAL_TABLET | Freq: Two times a day (BID) | ORAL | 0 refills | Status: AC

## 2017-03-21 MED ORDER — ATORVASTATIN CALCIUM 40 MG PO TABS: 40.0000 mg | ORAL_TABLET | Freq: Every evening | ORAL | 0 refills | Status: AC

## 2017-03-21 MED ORDER — METOPROLOL TARTRATE 25 MG PO TABS: 12.5000 mg | ORAL_TABLET | Freq: Two times a day (BID) | ORAL | 0 refills | Status: DC

## 2017-03-21 MED ORDER — PANTOPRAZOLE SODIUM 20 MG PO TBEC: 20.0000 mg | DELAYED_RELEASE_TABLET | Freq: Every day | ORAL | 0 refills | Status: AC

## 2017-03-21 MED ORDER — POLYSACCHARIDE IRON COMPLEX 150 MG PO CAPS: 150.0000 mg | ORAL_CAPSULE | Freq: Two times a day (BID) | ORAL | Status: DC

## 2017-03-21 MED ORDER — ALPRAZOLAM 0.25 MG PO TABS: 0.2500 mg | ORAL_TABLET | Freq: Every evening | ORAL | Status: DC | PRN

## 2017-03-21 MED ORDER — ALPRAZOLAM 0.25 MG PO TABS: 0.2500 mg | ORAL_TABLET | Freq: Every evening | ORAL | 0 refills | Status: DC | PRN

## 2017-03-21 MED ORDER — ALPRAZOLAM 0.25 MG PO TABS: 0.1250 mg | ORAL_TABLET | Freq: Every evening | ORAL | 0 refills | Status: AC | PRN

## 2017-03-21 MED ORDER — POLYSACCHARIDE IRON COMPLEX 150 MG PO CAPS: 150.0000 mg | ORAL_CAPSULE | Freq: Two times a day (BID) | ORAL | 0 refills | Status: AC

## 2017-03-21 MED ORDER — METOPROLOL TARTRATE 12.5 MG HALF TABLET: 12.5000 mg | ORAL_TABLET | Freq: Two times a day (BID) | ORAL | Status: DC

## 2017-03-21 MED ORDER — APIXABAN 5 MG PO TABS: 5.0000 mg | ORAL_TABLET | Freq: Two times a day (BID) | ORAL | 0 refills | Status: AC

## 2017-03-21 MED ORDER — ESCITALOPRAM OXALATE 5 MG PO TABS: 5.0000 mg | ORAL_TABLET | Freq: Every day | ORAL | 0 refills | Status: AC

## 2017-03-21 MED ORDER — ALPRAZOLAM 0.25 MG PO TABS: 0.1250 mg | ORAL_TABLET | Freq: Every evening | ORAL | 0 refills | Status: DC | PRN

## 2017-03-21 MED ORDER — METOPROLOL TARTRATE 25 MG/10 ML ORAL SUSPENSION
6.2500 mg | Freq: Two times a day (BID) | ORAL | Status: DC
  Filled 2017-03-21: qty 5

## 2017-03-21 NOTE — Progress Notes (Signed)
Patient with low BP after showers per nursing. Discussed with MD that lopressor 6.25 mg dosage unavailable on outpatient basis. Will discharge patient on lopressor 12.5 mg bid with instructions to hold if SBP<100 or HR< 60.  Will also order TEDs for use at home. They have BP cuff and have been instructed on measuring pulse.

## 2017-03-21 NOTE — Significant Event (Signed)
Patient scheduled lopressor held due to B/P 82/45 P 103 sitting. P. Love, PA notified order to hold and recheck B/P after lunch. 1322 B/P 123/46 P 81 lopressor 12.5 mg po given. Continue with plan of care.  [name]

## 2017-03-21 NOTE — Progress Notes (Signed)
Social Work Patient ID: Luretha Murphy, female   DOB: [birth year], 71 y.o.   MRN: [id number]   CSW met with pt and her partner, [name], 03-20-17 to update them on team conference discussion and went over d/c f/u items.  Pt feels prepared to go home and will be staying with her niece until she is cleared to travel.  [name] will take her to appts at [facility] and CSW provided him with directions.  Pt will have DME delivered to the room from [place].  CSW also gave pt information on stroke support group and 12 step AA meetings in the area. CSW also assisted pt with submitting release of information to both [facility] and [facility].  Pt pleased with her progress.  CSW remains available if needs arise.

## 2017-03-21 NOTE — Discharge Summary (Signed)
Physician Discharge Summary  Patient ID: Beth Austin MRN: [id number] DOB/AGE: 1946/05/09 71 y.o.  Admit date: 03/11/2017 Discharge date: 03/21/2017  Discharge Diagnoses:  Principal Problem:   Stroke (cerebrum) ([place]) Active Problems:   Seizures ([facility])   ETOH abuse   Anxiety state   Atrial fibrillation ([facility])   Panic disorder   Aortic dissection, thoracoabdominal ([facility])   Anemia   Discharged Condition: stable   Significant Diagnostic Studies: No results found.  Labs:  Basic Metabolic Panel: BMP Latest Ref Rng & Units 03/18/2017 03/12/2017 03/11/2017  Glucose 65 - 99 mg/dL 111(H) 96 102(H)  BUN 6 - 20 mg/dL 15 9 11   Creatinine 0.44 - 1.00 mg/dL 0.72 0.80 0.72  Sodium 135 - 145 mmol/L 136 137 137  Potassium 3.5 - 5.1 mmol/L 3.9 3.8 4.2  Chloride 101 - 111 mmol/L 105 105 105  CO2 22 - 32 mmol/L 23 25 25   Calcium 8.9 - 10.3 mg/dL 8.4(L) 8.6(L) 8.5(L)    CBC: CBC Latest Ref Rng & Units 03/18/2017 03/12/2017  WBC 4.0 - 10.5 K/uL 7.3 10.1  Hemoglobin 12.0 - 15.0 g/dL 8.0(L) 8.0(L)  Hematocrit 36.0 - 46.0 % 25.2(L) 25.8(L)  Platelets 150 - 400 K/uL 303 366     CBG: No results for input(s): GLUCAP in the last 168 hours.  Brief HPI:    Beth Austin is a 71 year old female who was emergently admitted to [facility]/26/2017 with seizure, pain radiating from chest to back and RLE due to aortic dissection.  She was intubated in ED and taken to OR emergently for repair of thoracoabdominal aortic dissection with Bental procedure and AVR by Dr. Maren Reamer. 2D echo with EF 40-45% with mild LVH, mild to moderately global LVD and mild MVR/TVR.  She has had seizures with right sided motor activity and neurology recommended increasing Keppra to 1000 mg bid and to remain on keppra lifelong due to family reports of episodes suggestive of epilepsy going back to 10 years. Hospital course significant for fluid overload, hypotension, A fib/A flutter requiring DCCV 9/11, findings of acute  /subacute infarct in left posterior limb internal capsule as well as issues with anxiety.  Therapy initiated and patient reported to have difficulty maintaining sternal precautions as well as  deficits in mobility and ability to carry out ADL tasks. CIR recommended by rehab team.    Hospital Course: Beth Austin was admitted to rehab 03/11/2017 for inpatient therapies to consist of PT, ST and OT at least three hours five days a week. Past admission physiatrist, therapy team and rehab RN have worked together to provide customized collaborative inpatient rehab. She was found to have irregular heart beat due to A fib with RVR at admission. Patient was asymptomatic and converted to NSR. Dr. Debara Pickett was consulted for input and recommended continuing amiodarone bid as well as initiating anticoagulation to decrease risk of strokes.   She was started on Eliquis and amiodarone was decreased to daily starting 9/27. Heart rate has been controlled but she has had issues with orthostatic changes with increase in activity. TEDs were ordered for BP support and cardiology was consulted for input. They recommended discontinuation of meteorology and fiollow up on outpatient basis. She is continent of bowel and bladder.  Chest incision is clean, dry and intact without signs of infection. Lexapro was added to help with mood stabilization and anxiety. She has been instructed to avoid  Alcohol and follow up with MD for medication adjustment and/or counseling. Her anxiety  levels has improved and she was weaned to low dose xanax at bedtime prn.  She has made good progress during her rehab stay and is modified independent at discharge. She will continue to receive follow up outpatient PT and OT at [facility] after discharge.    Rehab course: During patient's stay in rehab weekly team conferences were held to monitor patient's progress, set goals and discuss barriers to discharge. At admission, patient required min assist with  basic self care tasks and mobility. Speech therapy evaluation revealed mild high level executive functioning and memory deficits affecting complex tasks. She has had improvement in activity tolerance, balance, postural control, as well as ability to compensate for deficits. Speech therapy signed off on 9/20 as patient had met her goals and was able to complete functional and mildly complex tasks at modified independent level.  t She is able to complete ADL tasks at modified independent level. She is modified independent for transfers and is able to ambulate 150' with RW and increased time. She is able to climb 12 stairs at modified independent level.    Disposition: 01-Home or Self Care  Diet: Heart Healthy.   Special Instructions: 1. No strenuous activity. No driving. No Alcohol.  2. Continue to maintain sternal precautions.  3.  Wear support stocking daily. Take time with positional changes.    Discharge Medication List as of 03/21/2017  3:06 PM    START taking these medications   Details  apixaban (ELIQUIS) 5 MG TABS tablet Take 1 tablet (5 mg total) by mouth 2 (two) times daily., Starting Thu 03/21/2017, Normal    escitalopram (LEXAPRO) 5 MG tablet Take 1 tablet (5 mg total) by mouth at bedtime., Starting Thu 03/21/2017, Normal    iron polysaccharides (NIFEREX) 150 MG capsule Take 1 capsule (150 mg total) by mouth 2 (two) times daily before lunch and supper., Starting Thu 03/21/2017, Normal      CONTINUE these medications which have CHANGED   Details  amiodarone (PACERONE) 200 MG tablet Take 1 tablet (200 mg total) by mouth daily., Starting Fri 03/22/2017, Normal    atorvastatin (LIPITOR) 40 MG tablet Take 1 tablet (40 mg total) by mouth every evening., Starting Thu 03/21/2017, Normal    levETIRAcetam (KEPPRA) 1000 MG tablet Take 1 tablet (1,000 mg total) by mouth 2 (two) times daily., Starting Thu 03/21/2017, Normal    pantoprazole (PROTONIX) 20 MG tablet Take 1 tablet (20 mg total) by  mouth daily., Starting Thu 03/21/2017, Normal    sennosides-docusate sodium (SENOKOT-S) 8.6-50 MG tablet Take 1 tablet by mouth daily as needed for constipation., Starting Wed 03/20/2017, No Print      CONTINUE these medications which have NOT CHANGED   Details  acetaminophen (TYLENOL) 325 MG tablet Take 650 mg by mouth every 6 (six) hours as needed for headache (pain). , Historical Med    ALPRAZolam (XANAX) 0.25 MG tablet Take 0.125 mg by mouth at bedtime as needed for sleep/ anxiety., Historical Med--Rx # 10 pills     bisacodyl (DULCOLAX) 10 MG suppository Place 10 mg rectally daily., Historical Med    Melatonin 3 MG CAPS Take 6 mg by mouth at bedtime as needed (sleep). , Historical Med      STOP taking these medications     aspirin EC 81 MG tablet      hydrocortisone 2.5 % cream      ibuprofen (ADVIL,MOTRIN) 200 MG tablet      lidocaine (LIDODERM) 5 %  metoprolol tartrate (LOPRESSOR) 25 MG tablet      traMADol (ULTRAM) 50 MG tablet        Follow-up Information    Kirsteins, Luanna Salk, MD. Call.   Specialty:  Physical Medicine and Rehabilitation Why:  as needed Contact information: [place] [address] [phone]        Anderson,Curtis. Call on 03/28/2017.   Why:  Post op appointment at 1:20 pm Contact information: [address], [address], [place] [address] [phone]       Almyra Deforest, [place] Follow up on 04/11/2017.   Specialties:  Cardiology, Radiology Why:  PA for Dr. Hilty/Appointment at 1:30 pm for follow up on A fib. Contact information: [address] [place] [address] [phone]           Signed: Bary Leriche 03/21/2017, 5:25 PM

## 2017-03-21 NOTE — Discharge Instructions (Signed)
Inpatient Rehab Discharge Instructions  Beth Austin Discharge date and time:  03/21/17  Activities/Precautions/ Functional Status: Activity: No lifting, driving, or strenuous exercise for till cleared by MD. Continue sternal precautions.  Diet: cardiac diet Wound Care: keep wound clean and dry    Functional status:  ___ No restrictions     ___ Walk up steps independently ___ 24/7 supervision/assistance   ___ Walk up steps with assistance ___ Intermittent supervision/assistance  ___ Bathe/dress independently _X__ Walk with walker    _X__ Bathe/dress with assistance ___ Walk Independently    ___ Shower independently ___ Walk with assistance    ___ Shower with assistance _X__ No alcohol     ___ Return to work/school ________  COMMUNITY REFERRALS UPON DISCHARGE:   Outpatient: PT     OT              Agency:  [facility]                            [address]                            [place], [place]  [address]               Phone:  [phone]   Appointment Date/Time:  Tuesday, March 26, 2017  9:30 AM Occupational Therapy - arrive at 9 AM;  11 AM Physical Therapy Medical Equipment/Items Ordered:  18"x18" lightweight wheelchair with basic cushion; rollator; tub transfer bench  Agency/Supplier:  [facility]  GENERAL COMMUNITY RESOURCES FOR PATIENT/FAMILY: Support Groups:  [facility] Stroke Support Group                              Meets every 2nd Thursday of the month (except June, July, and August) from 3-4 PM                              In the dayroom of [facility] at [facility], [facility]                              For more information, please call [phone]                               12 Step Programs                               Please refer to the information Boneta Lucks gave you.  Special Instructions: 1. Take tepid water showers. Avoid hot showers as it can cause blood  pressure to drop. 2.  Take your time with positional changes. Wear support stocking when out of bed---this will help with blood pressure support.    STROKE/TIA DISCHARGE INSTRUCTIONS SMOKING Cigarette smoking nearly doubles your risk of having a stroke & is the single most alterable risk factor  If you smoke or have smoked in the last 12 months, you are advised to quit smoking for your health.  Most of the excess cardiovascular risk related to smoking disappears within a year of stopping.  Ask you doctor about anti-smoking medications  [place] Quit Line: 1-800-QUIT NOW  Free Smoking Cessation Classes [phone]  CHOLESTEROL Know your levels; limit fat & cholesterol in your diet  Lipid Panel  No results found for: CHOL, TRIG, HDL, CHOLHDL, VLDL, LDLCALC    Many patients benefit from treatment even if their cholesterol is at goal.  Goal: Total Cholesterol (CHOL) less than 160  Goal:  Triglycerides (TRIG) less than 150  Goal:  HDL greater than 40  Goal:  LDL (LDLCALC) less than 100   BLOOD PRESSURE American Stroke Association blood pressure target is less that 120/80 mm/Hg  Your discharge blood pressure is:  BP: (!) 132/59  Monitor your blood pressure  Limit your salt and alcohol intake  Many individuals will require more than one medication for high blood pressure  DIABETES (A1c is a blood sugar average for last 3 months) Goal HGBA1c is under 7% (HBGA1c is blood sugar average for last 3 months)  Diabetes: No known diagnosis of diabetes    No results found for: HGBA1C   Your HGBA1c can be lowered with medications, healthy diet, and exercise.  Check your blood sugar as directed by your physician  Call your physician if you experience unexplained or low blood sugars.  PHYSICAL ACTIVITY/REHABILITATION Goal is 30 minutes at least 4 days per week  Activity: No driving, Therapies: see above Return to work: to be decided in next few weeks  Activity decreases your risk of heart  attack and stroke and makes your heart stronger.  It helps control your weight and blood pressure; helps you relax and can improve your mood.  Participate in a regular exercise program.  Talk with your doctor about the best form of exercise for you (dancing, walking, swimming, cycling).  DIET/WEIGHT Goal is to maintain a healthy weight  Your discharge diet is: Diet Heart Room service appropriate? Yes; Fluid consistency: Thin  liquids Your height is:  Height:  (177.8 cm) Your current weight is: Weight: 78 kg (172 lb) Your Body Mass Index (BMI) is:  BMI (Calculated): 24.68  Following the type of diet specifically designed for you will help prevent another stroke.  You are at goal weight.  Your goal Body Mass Index (BMI) is 19-24.  Healthy food habits can help reduce 3 risk factors for stroke:  High cholesterol, hypertension, and excess weight.  RESOURCES Stroke/Support Group:  Call [phone]   STROKE EDUCATION PROVIDED/REVIEWED AND GIVEN TO PATIENT Stroke warning signs and symptoms How to activate emergency medical system (call 911). Medications prescribed at discharge. Need for follow-up after discharge. Personal risk factors for stroke. Pneumonia vaccine given:  Flu vaccine given:  My questions have been answered, the writing is legible, and I understand these instructions.  I will adhere to these goals & educational materials that have been provided to me after my discharge from the hospital.     My questions have been answered and I understand these instructions. I will adhere to these goals and the provided educational materials after my discharge from the hospital.  Patient/Caregiver Signature _______________________________ Date __________  Clinician Signature _______________________________________ Date __________  Please bring this form and your medication list with you to all your follow-up doctor's appointments.

## 2017-03-21 NOTE — Progress Notes (Signed)
Social Work Discharge Note  The overall goal for the admission was met for:   Discharge location: Yes - to niece's home until she f/u with all MDs and is cleared to travel to [employer] of Stay: Yes - 10 days  Discharge activity level: Yes - modified independent  Home/community participation: Yes  Services provided included: MD, RD, PT, OT, SLP, RN, TR, Pharmacy, Neuropsych and [place]: Private Insurance: [employer]  Follow-up services arranged: Outpatient: PT/OT at [facility], DME: [phone]" lightweight w/c with basic cushion and back; rollator; tub transfer bench and Patient/Family has no preference for HH/DME agencies  Comments (or additional information): Pt to go to her niece's home until she has been medically cleared to travel to [place] where she is building a new home and will have her dtr to assist her, as needed.  Patient/Family verbalized understanding of follow-up arrangements: Yes  Individual responsible for coordination of the follow-up plan: pt with her partner, [name], and adult children  Confirmed correct DME delivered: Trey Sailors 03/21/2017    Ehab Humber, Silvestre Mesi

## 2017-03-21 NOTE — Progress Notes (Signed)
Patient discharged to home with significant other after cardiology was consulted about blood pressure medications. Pts vitals were stable at discharge.

## 2017-03-21 NOTE — Progress Notes (Signed)
Subjective/Complaints:  Had dizziness yest am after lopressor, disucussed purpose of this med to prevent elevated HR  ROS: pt denies nausea, vomiting, diarrhea, cough, shortness of breath or chest pain    Objective: Vital Signs: Blood pressure 111/64, pulse 73, temperature 99.4 F (37.4 C), temperature source Oral, resp. rate 17, height  (1.778 m), weight 76.3 kg (168 lb 3.4 oz), SpO2 95 %. No results found. No results found for this or any previous visit (from the past 72 hour(s)).   HEENT: normal Cardio: RRR without murmur. No JVD   Resp: CTA Bilaterally without wheezes or rales. Normal effort  GI: BS positive and Non tender Extremity:  Pulses positive and No Edema Skin:   Intact, sternotomy healing well, dry Neuro: Alert/Oriented, 4+ BUE and BLE. Good sitting balance Musc/Skel:  Normal Gen NAD   Assessment/Plan: 1. 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 Stable for D/C today F/u cardiology in 3 weeks F/u Cardiovascular surgery in [facility] See D/C summary See D/C instructionsFIM: Function - Bathing Position: Shower Body parts bathed by patient: Right arm, Left arm, Chest, Abdomen, Front perineal area, Buttocks, Right upper leg, Left upper leg, Right lower leg, Left lower leg Body parts bathed by helper: Back Assist Level: No help, No cues Set up : To obtain items, To adjust water temperature, To open containers  Function- Upper Body Dressing/Undressing What is the patient wearing?: Pull over shirt/dress Pull over shirt/dress - Perfomed by patient: Thread/unthread left sleeve, Pull shirt over trunk, Put head through opening, Thread/unthread right sleeve Pull over shirt/dress - Perfomed by helper: Put head through opening, Pull shirt over trunk Assist Level: No help, No cues Set up : To obtain clothing/put away Function - Lower Body Dressing/Undressing What is the patient wearing?: Underwear, Pants, Socks, Shoes Position: Sitting EOB Underwear -  Performed by patient: Thread/unthread right underwear leg, Thread/unthread left underwear leg, Pull underwear up/down Underwear - Performed by helper: Thread/unthread right underwear leg Pants- Performed by patient: Thread/unthread right pants leg, Pull pants up/down, Thread/unthread left pants leg Pants- Performed by helper: Thread/unthread right pants leg Socks - Performed by patient: Don/doff left sock, Don/doff right sock Shoes - Performed by patient: Don/doff right shoe, Don/doff left shoe Assist for footwear: Independent Assist for lower body dressing: No Help, No cues  Function - Toileting Toileting activity did not occur: Safety/medical concerns Toileting steps completed by patient: Adjust clothing prior to toileting, Performs perineal hygiene, Adjust clothing after toileting Toileting Assistive Devices: Grab bar or rail Assist level: No help/no cues  Function - Archivist transfer activity did not occur: Safety/medical concerns Toilet transfer assistive device: Bedside commode Assist level to toilet: No Help, No cues Assist level from toilet: No Help, No cues Assist level to bedside commode (at bedside): No Help, no cues, assistive device, takes more than a reasonable amount of time Assist level from bedside commode (at bedside): No Help, no cues, assistive device, takes more than a reasonable amount of time  Function - Chair/bed transfer Chair/bed transfer activity did not occur: N/A Chair/bed transfer method: Ambulatory Chair/bed transfer assist level: No Help, no cues, assistive device, takes more than a reasonable amount of time Chair/bed transfer assistive device: Environmental consultant (rollator) Chair/bed transfer details: Verbal cues for precautions/safety  Function - Locomotion: Wheelchair Will patient use wheelchair at discharge?: No Function - Locomotion: Ambulation Ambulation activity did not occur: Safety/medical concerns Assistive device: Walker-rolling  (rollator) Max distance: 150 Assist level: No help, No cues, assistive device, takes more than a  reasonable amount of time Assist level: No help, No cues, assistive device, takes more than a reasonable amount of time Walk 50 feet with 2 turns activity did not occur: Safety/medical concerns Assist level: No help, No cues, assistive device, takes more than a reasonable amount of time Walk 150 feet activity did not occur: Safety/medical concerns Assist level: No help, No cues, assistive device, takes more than a reasonable amount of time Walk 10 feet on uneven surfaces activity did not occur: Safety/medical concerns Assist level: Supervision or verbal cues  Function - Comprehension Comprehension: Auditory Comprehension assist level: Follows complex conversation/direction with no assist  Function - Expression Expression: Verbal Expression assist level: Expresses complex ideas: With no assist  Function - Social Interaction Social Interaction assist level: Interacts appropriately with others - No medications needed.  Function - Problem Solving Problem solving assist level: Solves complex problems: Recognizes & self-corrects  Function - Memory Memory assist level: More than reasonable amount of time Patient normally able to recall (first 3 days only): Current season, Location of own room, Staff names and faces, That he or she is in a hospital  Medical Problem List and Plan: 1.  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 D/C today 2.  DVT Prophylaxis/Anticoagulation: Pharmaceutical: Lovenox- cardiology started Eliquis- will see pt in office about 3 wks 3. Pain Management:mainly opper back pain and incisional pain Will continue  ultram prn, warm Kpad for back 4. Mood: LCSW to follow for evaluation and support. 5. Neuropsych: This patient is not fully capable of making decisions on her own behalf. 6. Skin/Wound  Care: Monitor wound daily for healing. Maintain adequate hydration and nutritional status 7. Fluids/Electrolytes/Nutrition: Monitor I/O. Check lytes in am. 8. A fib s/p DCCV: Monitor HR bid. Continue amiodarone. Metoprolol resumed today--monitor for orthostatic symptoms.  Reassured pt that she can do therapy today Cardiology on consult, had episode of RVR 9/17- f/u~3wks post d/c Reduce lopressor to 6.25mg  BID hold for Sys BP<135mmHg  9. Anxiety disorder: occ Xanax , cont lexapro---will give limited Rx for Xanax 10tabs  10. Seizure activity: On keppra bid for lifelong basis per neurology.no seizure since admit to rehab 11. H/o Alcohol abuse: Has competed CIWA protocol.Pt states she drank in evenings to relax, "didn't realize how much I depended on it"  12.  Hypoalb start prostat- encourage PO 13.  ABLA stable  Hgb at 8.0   14.  S/p Bentall procedure, bioprosthetic valve  LOS (Days) 10 A FACE TO FACE EVALUATION WAS PERFORMED  KIRSTEINS,ANDREW E 03/21/2017, 7:13 AM

## 2017-03-22 ENCOUNTER — Telehealth: Payer: Self-pay

## 2017-03-22 NOTE — Telephone Encounter (Addendum)
Transitional Care call   Patient name: (Beth Austin) DOB: ([birth year]) 1. Are you/is patient experiencing any problems since coming home? (NO) a. Are there any questions regarding any aspect of care? (NO) 2. Are there any questions regarding medications administration/dosing? (NO) a. Are meds being taken as prescribed? (YES) b. "Patient should review meds with caller to confirm"  3. Have there been any falls? (NO) 4. Has Home Health been to the house and/or have they contacted you? (NO) a. If not, have you tried to contact them? (NO) b. Can we help you contact them? (NO 5. Are bowels and bladder emptying properly? (YES) a. Are there any unexpected incontinence issues? (NO) b. If applicable, is patient following bowel/bladder programs? (NO) 6. Any fevers, problems with breathing, unexpected pain? (NO) 7. Are there any skin problems or new areas of breakdown? (NO) 8. Has the patient/family member arranged specialty MD follow up (ie cardiology/neurology/renal/surgical/etc.)?  (YES) a. Can we help arrange? (NO) 9. Does the patient need any other services or support that we can help arrange? (NO) 10. Are caregivers following through as expected in assisting the patient? (YES) 11. Has the patient quit smoking, drinking alcohol, or using drugs as recommended? (NA)  Appointment date/time(04-13-2017 / [phone]), arrive time(1100AM)and who it is with here(DR. Wynn Banker) [address] [phone]

## 2017-03-26 ENCOUNTER — Telehealth: Payer: Self-pay

## 2017-03-26 ENCOUNTER — Ambulatory Visit: Payer: Managed Care, Other (non HMO) | Admitting: Physical Therapy

## 2017-03-26 ENCOUNTER — Emergency Department (HOSPITAL_COMMUNITY)
Admission: EM | Admit: 2017-03-26 | Discharge: 2017-03-26 | Disposition: A | Discharge status: Home / Self Care | Payer: Managed Care, Other (non HMO) | Attending: Emergency Medicine | Admitting: Emergency Medicine

## 2017-03-26 ENCOUNTER — Emergency Department (HOSPITAL_COMMUNITY): Payer: Managed Care, Other (non HMO)

## 2017-03-26 ENCOUNTER — Encounter: Payer: Self-pay | Admitting: Physical Therapy

## 2017-03-26 ENCOUNTER — Telehealth: Payer: Self-pay | Admitting: *Deleted

## 2017-03-26 ENCOUNTER — Encounter (HOSPITAL_COMMUNITY): Payer: Self-pay

## 2017-03-26 ENCOUNTER — Ambulatory Visit: Payer: Managed Care, Other (non HMO) | Attending: Physical Medicine & Rehabilitation | Admitting: *Deleted

## 2017-03-26 ENCOUNTER — Encounter: Payer: Self-pay | Admitting: *Deleted

## 2017-03-26 DIAGNOSIS — R29818 Other symptoms and signs involving the nervous system: Secondary | ICD-10-CM | POA: Diagnosis present

## 2017-03-26 DIAGNOSIS — R278 Other lack of coordination: Secondary | ICD-10-CM

## 2017-03-26 DIAGNOSIS — M6281 Muscle weakness (generalized): Secondary | ICD-10-CM | POA: Diagnosis not present

## 2017-03-26 DIAGNOSIS — I7103 Dissection of thoracoabdominal aorta: Secondary | ICD-10-CM | POA: Diagnosis present

## 2017-03-26 DIAGNOSIS — I481 Persistent atrial fibrillation: Secondary | ICD-10-CM | POA: Diagnosis not present

## 2017-03-26 DIAGNOSIS — I10 Essential (primary) hypertension: Secondary | ICD-10-CM | POA: Insufficient documentation

## 2017-03-26 DIAGNOSIS — I480 Paroxysmal atrial fibrillation: Secondary | ICD-10-CM | POA: Insufficient documentation

## 2017-03-26 DIAGNOSIS — R002 Palpitations: Secondary | ICD-10-CM | POA: Diagnosis present

## 2017-03-26 DIAGNOSIS — Z9889 Other specified postprocedural states: Secondary | ICD-10-CM

## 2017-03-26 DIAGNOSIS — Z79899 Other long term (current) drug therapy: Secondary | ICD-10-CM | POA: Insufficient documentation

## 2017-03-26 DIAGNOSIS — Z7901 Long term (current) use of anticoagulants: Secondary | ICD-10-CM | POA: Diagnosis not present

## 2017-03-26 DIAGNOSIS — R42 Dizziness and giddiness: Secondary | ICD-10-CM | POA: Diagnosis not present

## 2017-03-26 HISTORY — DX: Dissection of unspecified site of aorta: I71.00

## 2017-03-26 LAB — CBC
HEMATOCRIT: 28.9 % — AB (ref 36.0–46.0)
HEMOGLOBIN: 9.2 g/dL — AB (ref 12.0–15.0)
MCH: 28.5 pg (ref 26.0–34.0)
MCHC: 31.8 g/dL (ref 30.0–36.0)
MCV: 89.5 fL (ref 78.0–100.0)
Platelets: 296 10*3/uL (ref 150–400)
RBC: 3.23 MIL/uL — ABNORMAL LOW (ref 3.87–5.11)
RDW: 14.4 % (ref 11.5–15.5)
WBC: 5.6 10*3/uL (ref 4.0–10.5)

## 2017-03-26 LAB — BASIC METABOLIC PANEL
ANION GAP: 8 (ref 5–15)
BUN: 9 mg/dL (ref 6–20)
CHLORIDE: 103 mmol/L (ref 101–111)
CO2: 25 mmol/L (ref 22–32)
Calcium: 8.7 mg/dL — ABNORMAL LOW (ref 8.9–10.3)
Creatinine, Ser: 0.77 mg/dL (ref 0.44–1.00)
GFR calc Af Amer: >60 mL/min (ref >60)
GLUCOSE: 117 mg/dL — AB (ref 65–99)
POTASSIUM: 3.9 mmol/L (ref 3.5–5.1)
Sodium: 136 mmol/L (ref 135–145)

## 2017-03-26 LAB — I-STAT TROPONIN, ED: Troponin i, poc: 0 ng/mL (ref 0.00–0.08)

## 2017-03-26 MED ORDER — SODIUM CHLORIDE 0.9 % IV BOLUS (SEPSIS)
1000.0000 mL | Freq: Once | INTRAVENOUS | Status: AC
  Administered 2017-03-26: 1000 mL via INTRAVENOUS

## 2017-03-26 NOTE — ED Notes (Signed)
Pt called RN to room per nurses station stating MD told her she could leave and requested IV removal.  Pt signed AMA form prior to leaving.  Pt ambulated to lobby with family.

## 2017-03-26 NOTE — ED Provider Notes (Signed)
[facility] DEPT Provider Note   CSN: [id number] Arrival date & time: 03/26/17  1141     History   Chief Complaint Chief Complaint  Patient presents with  . Atrial Fibrillation    HPI Beth Austin is a 71 y.o. female.  HPI  Patient presents after an episode of dizziness, lightheadedness, palpitations, chest discomfort. Patient is a very notable history of recent aortic dissection, now status post repair 2 weeks ago. Patient was preparing for her first physical therapy session today, when soon after doing the initial enrollment activities she felt the onset of symptoms. Symptoms were worse with exertion, and now that she is resting she feels somewhat better. She denies recent fever, chills, vomiting, diarrhea, lightheadedness, syncope. She is not entirely sure of her medication, as she had substantial changes during her hospitalization forsurgery. However, the patient seems to have prior history of atrial fibrillation, and is likely taking anticoagulant.  Past Medical History:  Diagnosis Date  . Alcohol abuse   . Dvt femoral (deep venous thrombosis) (HCC)    after tibial fracture  . Dysrhythmia   . Generalized anxiety disorder with panic attacks   . GERD (gastroesophageal reflux disease)   . Hypertension   . PAF (paroxysmal atrial fibrillation) (HCC)    resolved with carotid massage in the past  . Right tibial fracture 1997   treated with cast    Patient Active Problem List   Diagnosis Date Noted  . Aortic dissection, thoracoabdominal (HCC) 03/21/2017  . Anemia 03/21/2017  . Panic disorder   . 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 Seizures (HCC)   . ETOH abuse   . Anxiety state   . Atrial fibrillation [facility])     Past Surgical History:  Procedure Laterality Date  . ASCENDING AORTIC ANEURYSM REPAIR W/ MECHANICAL AORTIC VALVE REPLACEMENT  02/26/2017    OB History    No data available       Home Medications    Prior to Admission medications     Medication Sig Start Date End Date Taking? Authorizing Provider  acetaminophen (TYLENOL) 325 MG tablet Take 650 mg by mouth every 6 (six) hours as needed for headache (pain).     [provider]  ALPRAZolam Prudy Feeler) 0.25 MG tablet Take 0.25 mg by mouth 2 (two) times daily as needed for anxiety.    [provider]  ALPRAZolam Prudy Feeler) 0.25 MG tablet Take 0.5 tablets (0.125 mg total) by mouth at bedtime as needed for anxiety. 03/21/17   Love, Evlyn Kanner, PA-C  amiodarone (PACERONE) 200 MG tablet Take 1 tablet (200 mg total) by mouth daily. 03/22/17   Love, Evlyn Kanner, PA-C  apixaban (ELIQUIS) 5 MG TABS tablet Take 1 tablet (5 mg total) by mouth 2 (two) times daily. 03/21/17   Love, Evlyn Kanner, PA-C  atorvastatin (LIPITOR) 40 MG tablet Take 1 tablet (40 mg total) by mouth every evening. 03/21/17   Love, Evlyn Kanner, PA-C  bisacodyl (DULCOLAX) 10 MG suppository Place 10 mg rectally daily.    [provider]  escitalopram (LEXAPRO) 5 MG tablet Take 1 tablet (5 mg total) by mouth at bedtime. 03/21/17   Love, Evlyn Kanner, PA-C  iron polysaccharides (NIFEREX) 150 MG capsule Take 1 capsule (150 mg total) by mouth 2 (two) times daily before lunch and supper. 03/21/17   Love, Evlyn Kanner, PA-C  levETIRAcetam (KEPPRA) 1000 MG tablet Take 1 tablet (1,000 mg total) by mouth 2 (two) times daily. 03/21/17   Jacquelynn Cree, PA-C  Melatonin  3 MG CAPS Take 6 mg by mouth at bedtime as needed (sleep).     [provider]  pantoprazole (PROTONIX) 20 MG tablet Take 1 tablet (20 mg total) by mouth daily. 03/21/17   Love, Evlyn Kanner, PA-C  sennosides-docusate sodium (SENOKOT-S) 8.6-50 MG tablet Take 1 tablet by mouth daily as needed for constipation. Patient not taking: Reported on 03/26/2017 03/20/17   Love, Evlyn Kanner, PA-C    Family History Family History  Problem Relation Age of Onset  . AAA (abdominal aortic aneurysm) Mother 38       diet of rupture  . Lung cancer Father 65  . Colon cancer Sister   .  Stroke Maternal Grandmother     Social History Social History  Substance Use Topics  . Smoking status: Never Smoker  . Smokeless tobacco: Never Used  . Alcohol use Not on file     Allergies   Latex and Remeron [mirtazapine]   Review of Systems Review of Systems  Constitutional:       Per HPI, otherwise negative  HENT:       Per HPI, otherwise negative  Respiratory:       Per HPI, otherwise negative  Cardiovascular:       Per HPI, otherwise negative  Gastrointestinal: Positive for nausea. Negative for vomiting.  Endocrine:       Negative aside from HPI  Genitourinary:       Neg aside from HPI   Musculoskeletal:       Per HPI, otherwise negative  Skin: Negative.   Neurological: Positive for light-headedness. Negative for syncope.     Physical Exam Updated Vital Signs BP 107/74   Pulse (!) 107   Temp 98.8 F (37.1 C) (Oral)   Resp 20   SpO2 98%   Physical Exam  Constitutional: She is oriented to person, place, and time. She appears well-developed and well-nourished. No distress.  HENT:  Head: Normocephalic and atraumatic.  Eyes: Conjunctivae and EOM are normal.  Cardiovascular: An irregularly irregular rhythm present. Tachycardia present.   Pulmonary/Chest: Effort normal and breath sounds normal. No stridor. No respiratory distress.  Abdominal: She exhibits no distension.  Musculoskeletal: She exhibits no edema.  Neurological: She is alert and oriented to person, place, and time. No cranial nerve deficit.  Skin: Skin is warm and dry.  Psychiatric: She has a normal mood and affect.  Nursing note and vitals reviewed.    ED Treatments / Results  Labs (all labs ordered are listed, but only abnormal results are displayed) Labs Reviewed  BASIC METABOLIC PANEL - Abnormal; Notable for the following:       Result Value   Glucose, Bld 117 (*)    Calcium 8.7 (*)    All other components within normal limits  CBC - Abnormal; Notable for the following:    RBC  3.23 (*)    Hemoglobin 9.2 (*)    HCT 28.9 (*)    All other components within normal limits  I-STAT TROPONIN, ED    EKG  EKG Interpretation  Date/Time:  Tuesday March 26 2017 11:44:01 EDT Ventricular Rate:  111 PR Interval:    QRS Duration: 88 QT Interval:  352 QTC Calculation: 478 R Axis:   6 Text Interpretation:  Atrial fibrillation with rapid ventricular response Low voltage QRS Septal infarct , age undetermined ST-t wave abnormality Abnormal ekg Confirmed by Gerhard Munch [phone]) on 03/26/2017 1:05:33 PM      On cardiac monitor the patient has variable heart  rate 90/110, atrial fibrillation, abnormal  CHADSVASC - 5  Radiology Dg Chest 2 View  Result Date: 03/26/2017 CLINICAL DATA:  Dizziness, atrial fibrillation, history of AAA repair and valve replacement recently EXAM: CHEST  2 VIEW COMPARISON:  None. FINDINGS: There is haziness at both lung bases consistent with pleural effusions left larger than right and mild basilar atelectasis. Mediastinal and hilar contours are unremarkable. The heart is within upper limits of normal. Aortic valve replacement is noted. No acute bony abnormality is seen. IMPRESSION: Bilateral pleural effusions left larger than right with bibasilar atelectasis. Electronically Signed   By: Dwyane Dee M.D.   On: 03/26/2017 15:32    Procedures Procedures (including critical care time)  Medications Ordered in ED Medications  sodium chloride 0.9 % bolus 1,000 mL (1,000 mLs Intravenous New Bag/Given 03/26/17 1312)     Initial Impression / Assessment and Plan / ED Course  I have reviewed the triage vital signs and the nursing notes.  Pertinent labs & imaging results that were available during my care of the patient were reviewed by me and considered in my medical decision making (see chart for details).  After the initial evaluation reviewed the patient's chart including documentation from [facility], where the patient initially went after  diversion of her airplane, and had subsequent revision repair of a descending aortic dissection.  Review notable for recommendation of aortic dissection repair, as well as x-ray results from within the past 2 weeks demonstrating pleural effusion, including as below. Result Impression  Stable chest with left larger than right small pleural effusions.    Signed (Electronic Signature): 03/10/2017 6:20 AM  Signed By: Idell Pickles, MD           3:55 PM Patient and her companion aware of all findings. I discussed patient's case with our cardiology colleagues who will consult on the patient's care. She remained awake and alert, in no distress, though mildly tachycardic, with persistent atrial fibrillation.  Next day chart completion. Patient had evaluation by our cardiology colleagues, recommend addition for continued amiodarone, substantial hydration, monitoring, outpatient follow-up.  Final Clinical Impressions(s) / ED Diagnoses  Weakness Atrial fibrillation   Gerhard Munch, MD 03/27/17 [phone]

## 2017-03-26 NOTE — Telephone Encounter (Signed)
physical therapist called concerned about patients heart rate of 96 with 3 skipped beats. Blood pressure 110/60. Physical therapists stated that patient became weak in the knees and dizzy. Physical therapist wanted to make Dr. Doroteo Bradford aware and said that she may need to send her to the hospital. When opening chart I noticed that she is currently admitted.

## 2017-03-26 NOTE — ED Notes (Signed)
Patient is very tearful, states she was d[facility]'d [facility] 9/[facility]. Was at outpt Neuro  rehab for a eval. And was dizzy, Patient states  She has been dizzy off and on since d/c. She states she feels like its the medicines she is on. States she had a stoke post surgery states her right hand is weaker than her left,

## 2017-03-26 NOTE — Telephone Encounter (Signed)
Patient called and left a message stating that she was recently discharged from [facility].  She had an aortic dissection and open heart surgery.  She says she is having rib pain and a constant ringing in her ears.  She is asking what can be done to help those issues

## 2017-03-26 NOTE — ED Notes (Signed)
Patient transported to X-ray 

## 2017-03-26 NOTE — Therapy (Signed)
[facility] [address] [place], [place], [address] Phone: [phone]   Fax:  [phone]  Patient Details  Name: TIFANY HIRSCH MRN: [id number] Date of Birth: April 30, 1946 Referring Provider:  No ref. provider found  Encounter Date: 03/26/2017   Upon standing in lobby area to come back for physical therapy evaluation, patient reports dizziness and feeling weak in the knees, asking to sit down.  Checked blood pressure manually, which was 110/58 at 1105.  Manually checked pt's HR, with HR 96 bpm with at least 3 skipped beats noted.  Pt and husband report taking blood pressure and HR measures since d/c from hospital and HR measures were in the 80s until yesterday, when they were running>100 bpm.  PT eval not completed.  Placed phone call to Dr. Jodean Lima office to make him aware of symptoms.  Advised patient to proceed to ED, based on pt's recent medical history and c/o today.     Darcy Barbara W. 03/26/2017, 9:39 PM  Lonia Blood, PT 03/26/17 9:43 PM Phone: [phone] Fax: [phone]     [facility] [address] [place], [place], [address] Phone: [phone]   Fax:  [phone]

## 2017-03-26 NOTE — ED Notes (Signed)
Ambulated with 2 person assist. To bathroom, states steady gait, states she feels ok not dizzy at present,

## 2017-03-26 NOTE — Therapy (Signed)
[facility] [address] [place], [place], [address] Phone: [phone]   Fax:  [phone]  Occupational Therapy Evaluation  Patient Details  Name: Beth Austin MRN: [id number] Date of Birth: 11-18-1945 Referring Provider: Dr Claudette Laws  Encounter Date: 03/26/2017      OT End of Session - 03/26/17 1025    Visit Number 1   Number of Visits 12   Authorization Type Cigna/C[employer]a Managed   OT Start Time [phone]   OT Stop Time 1012   OT Time Calculation (min) 44 min   Activity Tolerance Patient tolerated treatment well   Behavior During Therapy [facility] for tasks assessed/performed;Flat affect      Past Medical History:  Diagnosis Date  . Alcohol abuse   . Dvt femoral (deep venous thrombosis) (HCC)    after tibial fracture  . Dysrhythmia   . Generalized anxiety disorder with panic attacks   . GERD (gastroesophageal reflux disease)   . Hypertension   . PAF (paroxysmal atrial fibrillation) (HCC)    resolved with carotid massage in the past  . Right tibial fracture 1997   treated with cast    Past Surgical History:  Procedure Laterality Date  . ASCENDING AORTIC ANEURYSM REPAIR W/ MECHANICAL AORTIC VALVE REPLACEMENT  02/26/2017    There were no vitals filed for this visit.      Subjective Assessment - 03/26/17 0936    Subjective  "I was flying from [place] to [facility] when pt had pain in chest and she stood up and fell, plane made emergency landing" Pt plane landed in [place] and she went to [facility] where she had emergency surgery for disecting aorta and had stroke.   Pertinent History Sternal Precautions; NWB Right and Left UE's impacting independence with ADL/IADL's. Decreased activity tolerance and endurance. Seizures, ETOH abuse, Anxiety, Panic Disorder, A-fib, Anemia   Currently in Pain? Yes   Pain Score 3    Pain Location Abdomen  Right lower quadrent   Pain Orientation Right;Anterior   Pain  Descriptors / Indicators Sore  Pt questions if it may be due to coughing   Pain Type Acute pain   Pain Onset In the past 7 days  Began over the weekend   Pain Frequency Intermittent   Aggravating Factors  Coughing   Pain Relieving Factors Took tylenol   Effect of Pain on Daily Activities Sore, ache - aware but not stopping me from doing anything   Multiple Pain Sites No           [facility] OT Assessment - 03/26/17 0001      Assessment   Diagnosis Stroke (cerebrum) following aortic dissection   Referring Provider Dr Claudette Laws   Onset Date 02/26/17   Assessment Pt was hospitialized at [facility] in [place] where she had repair to her aortic dissection and then was transferred from [facility] to [facility]pt Rehab at [facility] 03/11/17-03/21/17   Prior Therapy In-pt Rehab, d/c on 03/21/17     Precautions   Precautions Sternal;Other (comment)  Bilateral UE's NWB   Precaution Comments Pt able to state all sternal precautions except "No push/pull"     Restrictions   Weight Bearing Restrictions Yes   RUE Weight Bearing Non weight bearing   LUE Weight Bearing Non weight bearing     Balance Screen   Has the patient fallen in the past 6 months No  Pt fell with initial Aortic dissection when on airplane 9/4   Has the  patient had a decrease in activity level because of a fear of falling?  Yes   Is the patient reluctant to leave their home because of a fear of falling?  No     Home  Environment   Family/patient expects to be discharged to: Private residence   Living Arrangements Spouse/significant other   Available Help at Discharge Family   Type of Home House   Home Access Stairs   Home Layout One level   Alternate Level Stairs - Number of Steps 2 STE   Bathroom Shower/Tub Tub/Shower unit   Shower/tub characteristics Development worker, international aid Yes   How accessible Accessible via walker   Adaptive equipment --  Hand held shower head, tub bench    Home Equipment Walker - 4 wheels;Tub bench;Hand held shower head;Wheelchair - manual   Lives With Spouse     Prior Function   Level of Independence --  Overall Mod I but does get assist from husband socks/shoes   Vocation Other (comment)  Took semester off, plans to return in Jan   Vocation Requirements 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      ADL   Eating/Feeding Minimal assistance  "Terrible appetite" per pt report   Grooming Modified independent   Upper Body Bathing Minimal assistance  to wash hair and above 90* activities   Lower Body Bathing Minimal assistance   Lower Body Bathing Patient Percentage --  Sits on tub bench using hand held shower head   Upper Body Dressing Minimal assistance  For over head shirts only; Mod I button up   Lower Body Dressing Increased time;Minimal assistance  Assist wtih shoes/socks   Toilet Transfer Modified independent   Toileting - Clothing Manipulation Modified independent   Toileting -  Hygiene Modified Independent   Tub/Shower Transfer Supervision/safety   ADL comments Pt reports that dizziness is limiting factor for ADL's as are sternal precautions. She has called left message for MD office     IADL   Shopping --  Husband currently shopping   Light Housekeeping Does not participate in any housekeeping tasks   Meal Prep --  Husband currently assisting with meal prep   Medication Management Is responsible for taking medication in correct dosages at correct time     Mobility   Mobility Status Independent     Written Expression   Dominant Hand Right     Vision - History   Baseline Vision No visual deficits   Additional Comments Wears glasses at all times     Activity Tolerance   Activity Tolerance Comments Pt reports overall decreased activitiy tolerance     Cognition   Overall Cognitive Status Within Functional Limits for tasks assessed   Memory Appears intact     Sensation   Light Touch Appears  Intact  "Not as clear" on right hand/digits   Additional Comments Pt appears intact to LT on bilateral hands however reports that R is not as clear as L     Coordination   Gross Motor Movements are Fluid and Coordinated Yes  within sternal precaution limitiations   Fine Motor Movements are Fluid and Coordinated No  Decreased    9 Hole Peg Test Right;Left   Right 9 Hole Peg Test 36.59 secondds  Increased time, slow movements   Left 9 Hole Peg Test 26.06 seconds  Increased fluidity   Coordination Reports decreased coordination R dominant UE overall for daily activitiy  ROM / Strength   AROM / PROM / Strength AROM     AROM   Overall AROM  Other (comment)   Overall AROM Comments Overall AROM bilateral UE's WFL's adhering to sternal precautions of nothing above 90*     Hand Function   Right Hand Grip (lbs) 30   Left Hand Grip (lbs) 37     Functional Reaching Activities   Low Level WFL's   Mid Level NT secondary to sternal precautions   High Level NT as per above                         OT Education - 03/26/17 1024    Education provided Yes   Education Details Role of OT, Evaluation findings and recommendations   Person(s) Educated Patient;Spouse   Methods Explanation   Comprehension Verbalized understanding;Need further instruction          OT Short Term Goals - 03/26/17 1107      OT SHORT TERM GOAL #1   Title Pt will be Independent with HEP for RUE fine motor control    Time 4   Period Weeks   Status New     OT SHORT TERM GOAL #2   Title Pt will independently state sternal precautions and implement at independent level during ADL and self care tasks   Time 4   Period Weeks   Status New           OT Long Term Goals - 03/26/17 1109      OT LONG TERM GOAL #1   Title Pt demonstrate increased endurance/activity tolerance by independently preparing a simple meal/snack in ADL kitchen over 20-40 min time period (while adhering to precautions)  .   Time 8   Period Weeks   Status New     OT LONG TERM GOAL #2   Title Pt will be Independent don/doff socks/shoes and LB ADL's using a/e PRN and compensatory techniques sit to stand.   Time 8   Period Weeks   Status New     OT LONG TERM GOAL #3   Title Pt will demonstrate improved coordination R UE as seen by improved 9 hole peg test score by 10 seconds or more   Baseline 03/26/17 R = 36.59 sec L = 26.09 sec   Time 8   Period Weeks   Status New     OT LONG TERM GOAL #4   Title Pt will report increased fluid movement/coordination right UE, when working on computer for simulated work related tasks   Baseline Increased time, decreased fluidity   Time 8   Period Weeks   Status New     OT LONG TERM GOAL #5   Title Pt will be Mod I washing, styling her hair and donning shirt overhead when sitting (when cleared of sternal precautions).   Baseline 03/26/17 Min A   Time 8   Period Weeks   Status New               Plan - 03/26/17 1041    Clinical Impression Statement Pt is a 71 y/o R HD female whom presents to out-pt Neuro Rehab following aortic dissection (thoracoabdominal) and acute/subacute infarct in left posterior limb internal capsule. Per pt  this ocurred when she was traveling by airplane from [place] back home to [place]. The plane made an emergency landing and admitted to [facility] on 02/26/17. She was hospitialized there from 02/26/17-03/11/17 and transferred to  [place] [facility] from 03/11/17-03/21/17. PMH: includes seizures, ETOH abuse, anxietym A-fib, panic disorder, anemia. Prior to this event, she was very active and independent (working out 3+ times per week). She presents to out-pt OT today ambulating with a 4 wheeled RW ("I have a w/c, but I don't use it"). She has sternal precautions and is NWB LUE and NWB RUE. She currently demonstrates deficits in overall activity tolerance, fine motor control and ADL's. 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 She is currently staying in her niece's one story house here in [place] with PRN assistance from her husband. She plans to return to work in January 2019 if cleared by her MD for teaching. She should benefit from out-pt Neuro Rehab to assist with increasing ADL independence while adhering to precautions, increasing fine motor control and eventual strengthening/endurance when medically able. Pt and her husband were in verbal agreement of this. Pt has PT appointment later today.   Occupational performance deficits (Please refer to evaluation for details): ADL's;IADL's;Education   Rehab Potential Good   OT Frequency 2x / week   OT Duration 6 weeks  Pt plans to d/c home to [facility] when medically able & cont therapy at home   OT Treatment/Interventions Self-care/ADL training;Therapeutic exercise;Neuromuscular education;Energy conservation;Therapeutic exercises;Patient/family education;DME and/or AE instruction;Therapeutic activities   Plan Issue HEP for FM control/coordination RUE vs ADL's while adhering to sternal precautions.   Clinical Decision Making Several treatment options, min-mod task modification necessary   Recommended Other Services PT Evaluation & f/u with MD re: c/o dizziness when taking medicaitons   Consulted and Agree with Plan of Care Patient;Family member/caregiver   Family Member Consulted Husband      Patient will benefit from skilled therapeutic intervention in order to improve the following deficits and impairments:  Decreased knowledge of use of DME, Cardiopulmonary status limiting activity, Decreased coordination, Decreased activity tolerance, Decreased endurance, Decreased range of motion, Decreased strength, Impaired UE functional use, Decreased knowledge of precautions  Visit Diagnosis: Muscle weakness (generalized) - Plan: OT PLAN OF CARE CERT/RE-CERT  Other lack of coordination - Plan: OT PLAN OF CARE CERT/RE-CERT  Dissection of thoracoabdominal  aorta (HCC) - Plan: OT PLAN OF CARE CERT/RE-CERT  Other symptoms and signs involving the nervous system - Plan: OT PLAN OF CARE CERT/RE-CERT    Problem List Patient Active Problem List   Diagnosis Date Noted  . Aortic dissection, thoracoabdominal (HCC) 03/21/2017  . Anemia 03/21/2017  . Panic disorder   . 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 Seizures (HCC)   . ETOH abuse   . Anxiety state   . Atrial fibrillation [facility])     Uma Jerde Dionicio Stall, OTR/L 03/26/2017, 11:36 AM  [facility] [address] [place], [place], [address] Phone: [phone]   Fax:  [phone]  Name: Beth Austin MRN: [id number] Date of Birth: Mar 31, 1946

## 2017-03-26 NOTE — Telephone Encounter (Signed)
Rib pain would respond to an Over the counter Lidocaine patch to be worn 12h per day Not sure about the ringing in the ears, I can see her in office if she would like evaluation for this

## 2017-03-26 NOTE — ED Triage Notes (Signed)
Pt presents for evaluation of dizziness and atrial fibrillation. Reports on elliquis, hx of cardiac surgery 02/27/17. Pt hypotensive in triage. Pt AxO x4. Denies CP, reports mild SOB.

## 2017-03-26 NOTE — Consult Note (Signed)
Cardiology Consult    Patient ID: BRYSON PALEN; [id number]; May 18, 1946   Admit date: 03/26/2017 Date of Consult: 03/26/2017  Primary Care Provider: Jacquelynn Cree, PA-C Primary Cardiologist: Dr. Rennis Golden   Patient Profile    MAECY PODGURSKI is a 71 y.o. female with past medical history of Type A aortic dissection with Bental procedure and AVR with a 27 mm bioprosthetic valve (on 02/26/2017 at [facility]), seizure disorder, recent CVA (on 03/07/2017) and PAF (recent DCCV on 03/05/2017 with known recurrence on 9/17) who is being seen today for the evaluation of atrial fibrillation with RVR at the request of Dr. Jeraldine Loots.   History of Present Illness    Ms. Poellnitz was recently admitted to [facility] on 03/11/2017 but was noted to have worsening dyspnea and found to be back in atrial fibrillation with RVR. She converted back to NSR without intervention. She had been on Amiodarone  BID since recent hospital discharge. With her being further out from her dissection, she was started on Eliquis  BID and participated in rehab until she was discharged on 03/21/2017.  She presented back to [facility] ED on 03/26/2017 for evaluation of worsening dizziness. Reports her dizziness is worse when turning her head from side-to-side or changing positions. No associated palpitations, chest pain, dyspnea, or presyncope. No recent orthopnea, PND, or lower extremity edema.   She is unaware when she is in atrial fibrillation and denies any symptoms at this time. She reports good compliance with her medication regimen but feels like her medications are contributing to her dizziness. She has been checking HR and BP regularly at home and reports HR is usually in the 80's to low-100's. BP has been variable with SBP in the 80's at times and peaking into the 140's. Reports her dizziness is exacerbated when BP is soft.    Initial labs show Na+ of 136, K+ 3.9, creatinine 0.77. WBC 5.6, Hgb 9.2 (improved  from 8.0 on 03/18/2017). Initial troponin negative. CXR pending. EKG shows atrial fibrillation, HR 111. She remains in atrial fibrillation with HR in the 90's to 110's by telemetry.   Past Medical History   Past Medical History:  Diagnosis Date  . Alcohol abuse   . Aortic dissection (HCC)    a. s/p Type A aortic dissection with Bental procedure and AVR with a 27 mm bioprosthetic valve (on 02/26/2017 at [facility])  . Dvt femoral (deep venous thrombosis) (HCC)    after tibial fracture  . Dysrhythmia   . Generalized anxiety disorder with panic attacks   . GERD (gastroesophageal reflux disease)   . Hypertension   . PAF (paroxysmal atrial fibrillation) (HCC)    resolved with carotid massage in the past  . Right tibial fracture 1997   treated with cast     Allergies:   Allergies  Allergen Reactions  . Latex Itching    Pain patch put on back at [facility] caused itching  . Remeron [Mirtazapine]     Confusion/sedation    Home Medications:   Home Medications:  Prior to Admission medications   Medication Sig Start Date End Date Taking? Authorizing Provider  ALPRAZolam (XANAX) 0.25 MG tablet Take 0.25 mg by mouth 2 (two) times daily as needed for anxiety.   Yes [provider]  ALPRAZolam (XANAX) 0.25 MG tablet Take 0.5 tablets (0.125 mg total) by mouth at bedtime as needed for anxiety. 03/21/17  Yes Love, Evlyn Kanner, PA-C  amiodarone (PACERONE) 200 MG tablet  Take 1 tablet (200 mg total) by mouth daily. 03/22/17  Yes Love, Evlyn Kanner, PA-C  apixaban (ELIQUIS) 5 MG TABS tablet Take 1 tablet (5 mg total) by mouth 2 (two) times daily. 03/21/17  Yes Love, Evlyn Kanner, PA-C  atorvastatin (LIPITOR) 40 MG tablet Take 1 tablet (40 mg total) by mouth every evening. 03/21/17  Yes Love, Pamela S, PA-C  escitalopram (LEXAPRO) 5 MG tablet Take 1 tablet (5 mg total) by mouth at bedtime. 03/21/17  Yes Love, Evlyn Kanner, PA-C  iron polysaccharides (NIFEREX) 150 MG capsule Take 1 capsule (150 mg total) by  mouth 2 (two) times daily before lunch and supper. 03/21/17  Yes Love, Evlyn Kanner, PA-C  levETIRAcetam (KEPPRA) 1000 MG tablet Take 1 tablet (1,000 mg total) by mouth 2 (two) times daily. 03/21/17  Yes Love, Evlyn Kanner, PA-C  pantoprazole (PROTONIX) 20 MG tablet Take 1 tablet (20 mg total) by mouth daily. 03/21/17  Yes Love, Evlyn Kanner, PA-C  sennosides-docusate sodium (SENOKOT-S) 8.6-50 MG tablet Take 1 tablet by mouth daily as needed for constipation. Patient not taking: Reported on 03/26/2017 03/20/17   Jacquelynn Cree, PA-C    Inpatient Medications    Scheduled Meds:  Continuous Infusions:  PRN Meds:   Family History    Family History  Problem Relation Age of Onset  . AAA (abdominal aortic aneurysm) Mother 66       diet of rupture  . Lung cancer Father 63  . Colon cancer Sister   . Stroke Maternal Grandmother     Social History    Social History   Social History  . Marital status: Married    Spouse name: N/A  . Number of children: N/A  . Years of education: N/A   Occupational History  . Not on file.   Social History Main Topics  . Smoking status: Never Smoker  . Smokeless tobacco: Never Used  . Alcohol use Not on file  . Drug use: Unknown  . Sexual activity: Not on file   Other Topics Concern  . Not on file   Social History Narrative  . No narrative on file     Review of Systems    General:  No chills, fever, night sweats or weight changes.  Cardiovascular:  No chest pain, dyspnea on exertion, edema, orthopnea, palpitations, paroxysmal nocturnal dyspnea.  Dermatological: No rash, lesions/masses Respiratory: No cough, dyspnea Urologic: No hematuria, dysuria Abdominal:   No nausea, vomiting, diarrhea, bright red blood per rectum, melena, or hematemesis Neurologic:  No visual changes, wkns, changes in mental status. Positive for dizziness.   All other systems reviewed and are otherwise negative except as noted above.  Physical Exam/Data    Blood pressure  123/72, pulse 85, temperature 98.8 F (37.1 C), temperature source Oral, resp. rate 20, SpO2 99 %.  General: Pleasant Caucasian female appearing in NAD Psych: Normal affect. Neuro: Alert and oriented X 3. Moves all extremities spontaneously. HEENT: Normal  Neck: Supple without bruits or JVD. Lungs:  Resp regular and unlabored, CTA without wheezing or rales. Heart: Irregularly irregular no s3, s4, or murmurs. Abdomen: Soft, non-tender, non-distended, BS + x 4.  Extremities: No clubbing, cyanosis or lower extremity edema. DP/PT/Radials 2+ and equal bilaterally.   EKG:  The EKG was personally reviewed and demonstrates: Atrial fibrillation, HR 111. Telemetry:  Telemetry was personally reviewed and demonstrates:  Atrial fibrillation, HR in low-100's to 110's.    Labs/Studies     Relevant CV Studies:  Echocardiogram: 02/27/2017 IMPRESSIONS  No prior study for comparison.  Normal global left ventricular size. Mild left ventricular hypertrophy.  Normal left ventricular ejection fraction estimated at 40-45% .  Mid to distal anteroseptal and probable apical hypokinesis noted.  Mild to moderately reduced global left ventricular systolic function.  Septal E/E' ratio is 10 indicating normal filling pressure.  Abnormal left ventricular diastolic filling pattern for age.  Right ventricle at upper limits of normal, with normal systolic function.  Mild mitral regurgitation.  Bioprosthetic aortic valve replacement well seated.Trace aortic insufficiency central and perivalvular jet.  Mild tricuspid regurgitation.Estimated PASP of at least 27-32 mmHg.  No pericardial effusion.  Aortic root and proximal ascending aorta not well visualized. Dilated IVC.  Laboratory Data:  Chemistry  Recent Labs Lab 03/26/17 1154  NA 136  K 3.9  CL 103  CO2 25  GLUCOSE 117*  BUN 9  CREATININE 0.77  CALCIUM 8.7*  GFRNONAA >60  GFRAA >60  ANIONGAP 8    No results for input(s): PROT, ALBUMIN, AST,  ALT, ALKPHOS, BILITOT in the last 168 hours. Hematology  Recent Labs Lab 03/26/17 1154  WBC 5.6  RBC 3.23*  HGB 9.2*  HCT 28.9*  MCV 89.5  MCH 28.5  MCHC 31.8  RDW 14.4  PLT 296   Cardiac EnzymesNo results for input(s): TROPONINI in the last 168 hours.   Recent Labs Lab 03/26/17 1325  TROPIPOC 0.00    BNPNo results for input(s): BNP, PROBNP in the last 168 hours.  DDimer No results for input(s): DDIMER in the last 168 hours.  Radiology/Studies:  Dg Chest 2 View  Result Date: 03/26/2017 CLINICAL DATA:  Dizziness, atrial fibrillation, history of AAA repair and valve replacement recently EXAM: CHEST  2 VIEW COMPARISON:  None. FINDINGS: There is haziness at both lung bases consistent with pleural effusions left larger than right and mild basilar atelectasis. Mediastinal and hilar contours are unremarkable. The heart is within upper limits of normal. Aortic valve replacement is noted. No acute bony abnormality is seen. IMPRESSION: Bilateral pleural effusions left larger than right with bibasilar atelectasis. Electronically Signed   By: Dwyane Dee M.D.   On: 03/26/2017 15:32     Assessment & Plan    1. Dizziness - this is her main presenting symptom and she reports her dizziness is exacerbated with turning her head from side to side or changing positions. Dizziness was occurring even when she was in NSR but is exacerbated when in atrial fibrillation or hypotensive. Notes ringing in her ears as well.  - initial labs show Na+ of 136, K+ 3.9, creatinine 0.77. WBC 5.6, Hgb 9.2 (improved from 8.0 on 03/18/2017). Initial troponin negative. EKG shows atrial fibrillation, HR 111. She remains in atrial fibrillation with HR in the 90's to 110's by telemetry.  - will check orthostatics. Would avoid giving further IVF at this time with recent CXR showing pleural effusions. - symptoms are concerning for BPPV vs. Meniere's Disease (espesially with concurrent tinnitus). Consider trial of Meclizine  and follow-up with ENT as an outpatient if symptoms persist.   2. Paroxysmal Atrial Fibrillation - reports a history of PAF in her 20's and no known recurrence until her surgery in 02/2017. She underwent a DCCV on 03/05/2017 with known recurrence on 9/17. Had been started on Amiodarone while still at [facility] and this was continued during her recent hospitalization. Was discharged from Inpatient rehab on 9/27 with Amiodarone  daily. Would not pursue repeat DCCV at this time but her recent DCCV just a few  weeks ago only holding for a few days. Rate-control options are limited as SBP is in the 80's at times and wound therefore avoid the addition of BB or CB therapy at this time.  - she denies palpitations, chest pain, or dyspnea.  - This patients CHA2DS2-VASc Score and unadjusted Ischemic Stroke Rate (% per year) is equal to 7.2 % stroke rate/year from a score of 5 (Female, CHF, Age, CVA (2)). On Eliquis  BID for anticoagulation.   3. Aortic Dissection - s/p repair of a Type A aortic dissection with Bental procedure and AVR with a 27 mm bioprosthetic valve on 02/26/2017 at [facility]. - BP stable at 97/65 - 123/74 while in the ED.  4. S/p AVR - s/p Bental procedure on 02/26/2017 with AVR using a 27 mm bioprosthetic valve.  - repeat echocardiogram as an outpatient.   5. Chronic Systolic CHF - EF reduced at 40-45% by echo earlier this month. - she does not appear volume overloaded by physical examination.  She was given a 1L fluid bolus while in the ED and CXR resulted showing haziness along both lung bases, consistent with pleural effusions. Would hold off on further IVF at this time.   6. Recent CVA - MRI on 03/07/2017 showed an acute to early subacute small vessel infarction left posterior limb of the internal capsule. - she has not yet established care with a Neurologist. Recommended outpatient referral.   - remains on Eliquis and statin therapy.   7. Anemia -  Hgb 9.2 (improved from  8.0 on 03/18/2017).  - she denies any evidence of active bleeding.    Signed, Ellsworth Lennox, PA-C 03/26/2017, 4:04 PM Pager: [phone]  I have examined the patient and reviewed assessment and plan and discussed with patient.  Complex recent history in th is patient from [facility].  Agree with above as stated.  AFib with borderline rate control.  Meds limited by blood pressure.  COntinue Amio.  She feels ok, but was sent to the ER due to irregular pulse.  I encouraged her to stay hydrated.  SHe should return fro severe pain, refractory shortness of breath or persistent palpitations.   COnsider repeat cardioversion after continued Amio dosing.  Encouraged her to stay well hydrated.  OK to send home from cardiac standpoint as she has close followup.    Lance Muss

## 2017-03-26 NOTE — Telephone Encounter (Signed)
I gave her the information.  She was seen in the ED today as per previous message.

## 2017-03-27 NOTE — Telephone Encounter (Signed)
MD was notified by [facility] RN

## 2017-04-01 ENCOUNTER — Ambulatory Visit: Payer: Managed Care, Other (non HMO) | Admitting: Physical Therapy

## 2017-04-05 ENCOUNTER — Encounter: Payer: Managed Care, Other (non HMO) | Admitting: Physical Medicine & Rehabilitation

## 2017-04-11 ENCOUNTER — Ambulatory Visit: Payer: Managed Care, Other (non HMO) | Admitting: Physician Assistant

## 2017-04-25 ENCOUNTER — Ambulatory Visit: Payer: Managed Care, Other (non HMO) | Admitting: Internal Medicine

## 2018-02-14 IMAGING — DX DG CHEST 2V
2 series · 2 of 2 positions shown · non-contrast
Comparison: None.

CLINICAL DATA: Dizziness, atrial fibrillation, history of AAA
repair and valve replacement recently

EXAM:
CHEST  2 VIEW

[chest lat]
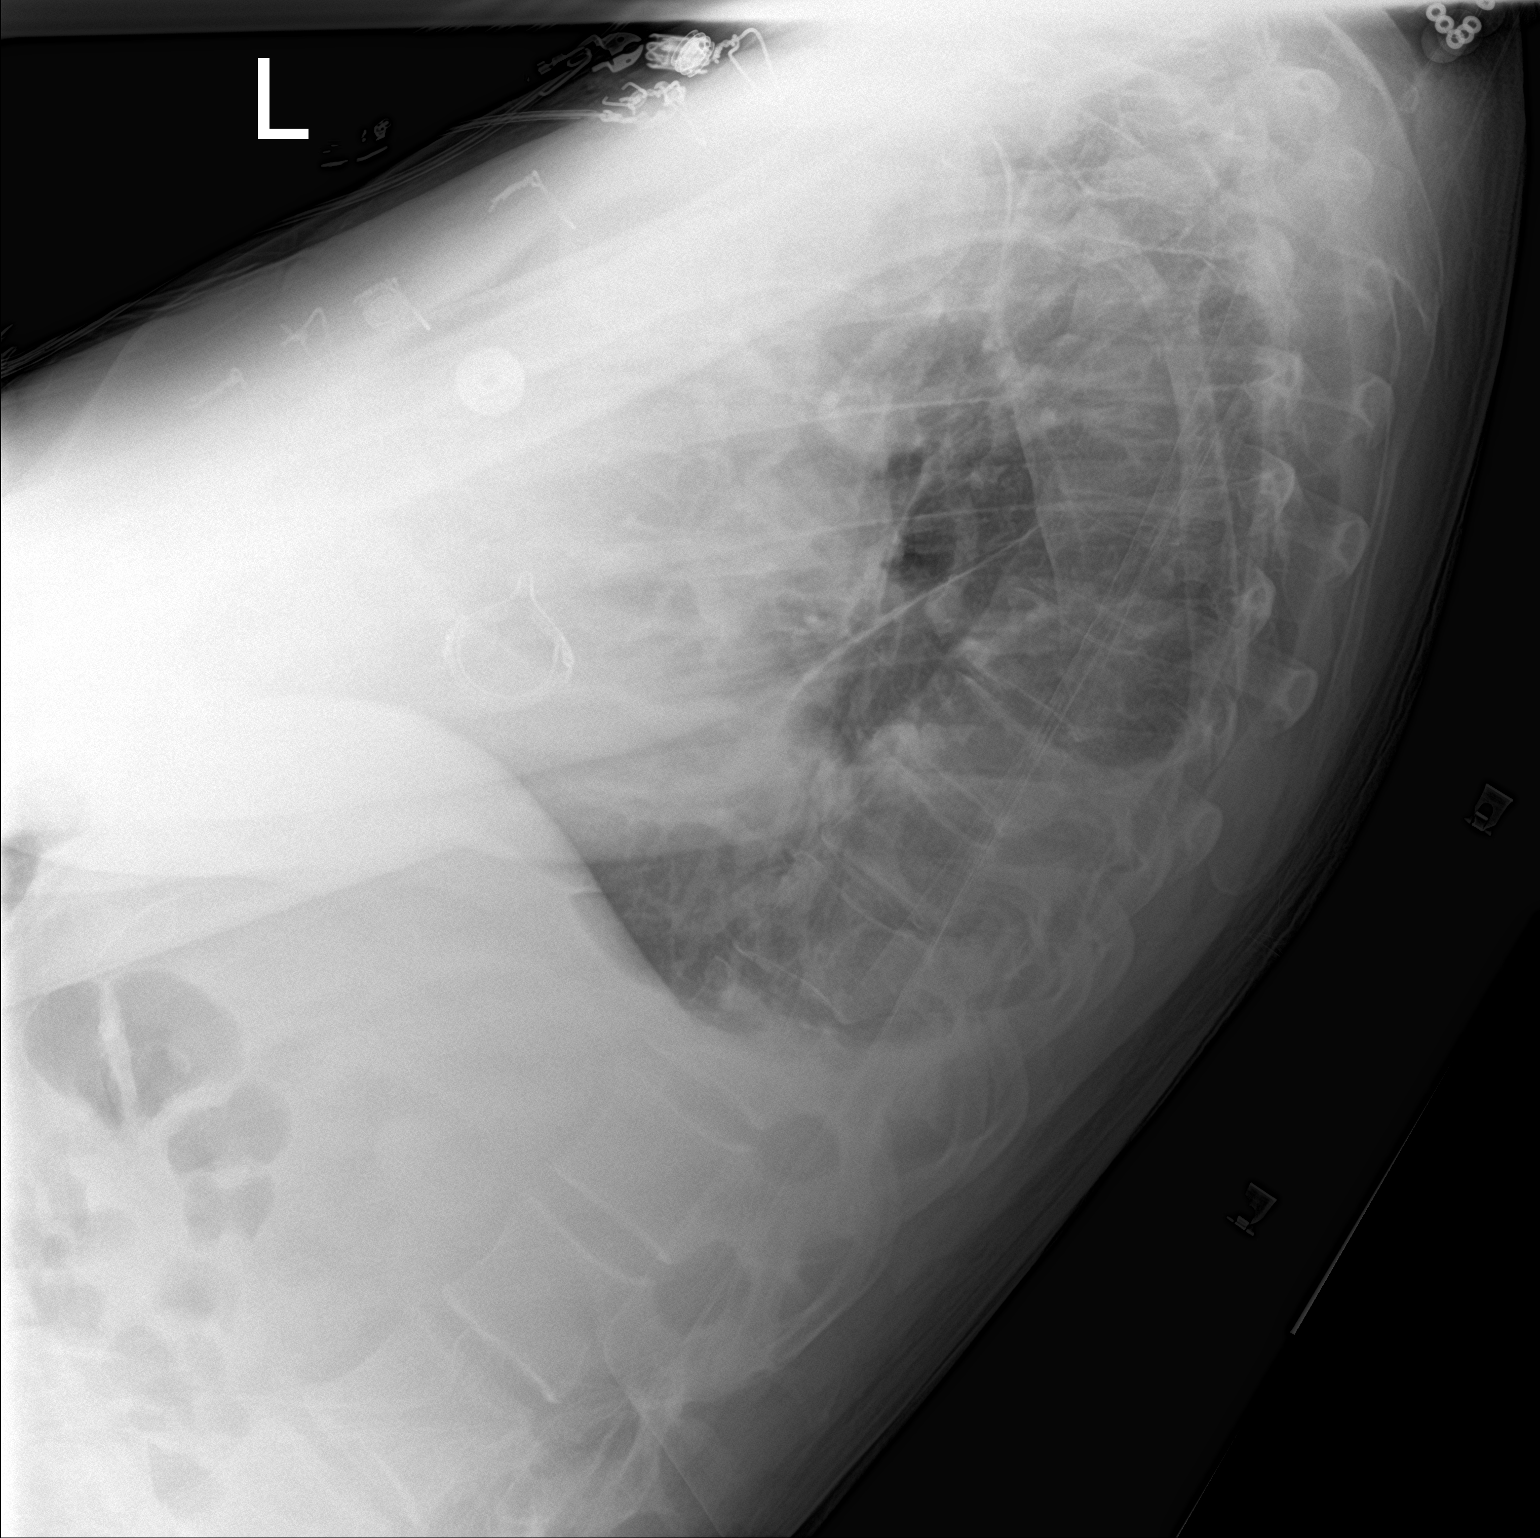

[chest ap]
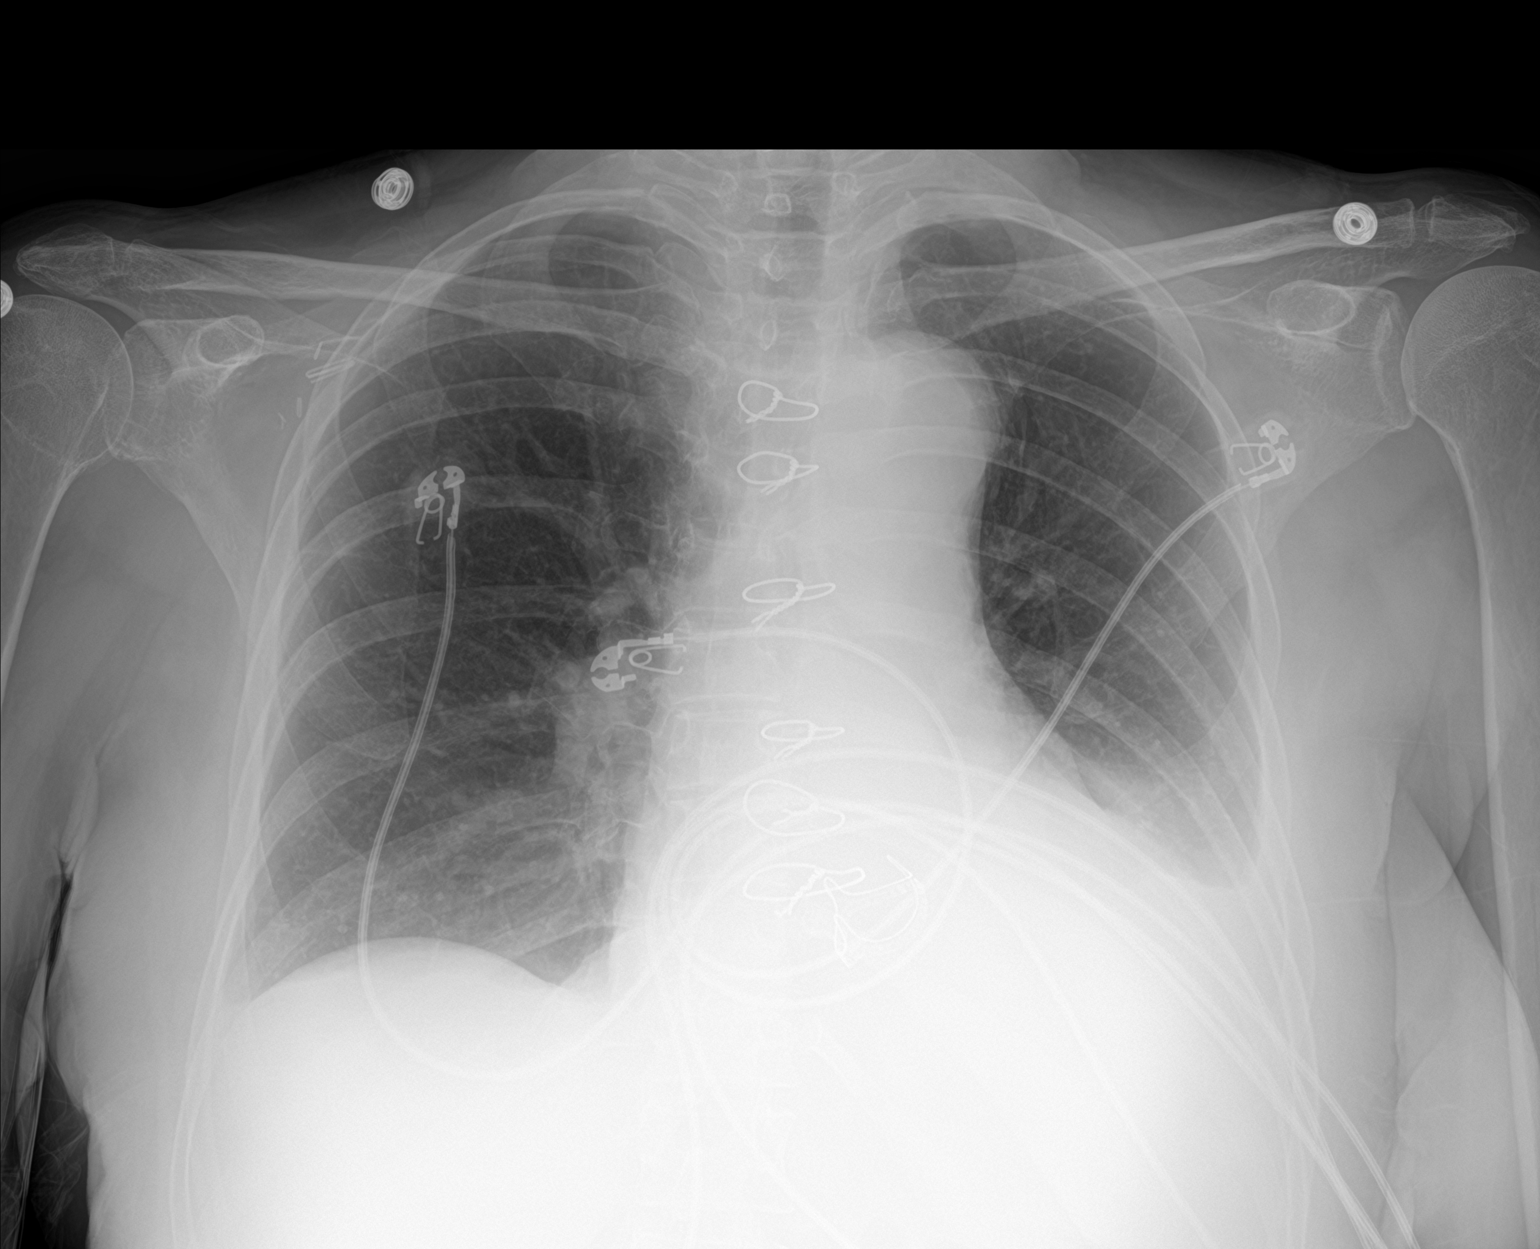

[2 of 2 positions shown; findings below may reference images not displayed]

FINDINGS: There is haziness at both lung bases consistent with pleural
effusions left larger than right and mild basilar atelectasis.
Mediastinal and hilar contours are unremarkable. The heart is within
upper limits of normal. Aortic valve replacement is noted. No acute
bony abnormality is seen.
IMPRESSION: Bilateral pleural effusions left larger than right with bibasilar
atelectasis.
